# Patient Record
Sex: Female | Born: 1955 | Race: White | Hispanic: No | Marital: Married | Smoking: Former smoker
Health system: Southern US, Community
[De-identification: ages and names within clinical notes are randomized; demographics above are authoritative.]

## PROBLEM LIST (undated history)

## (undated) DIAGNOSIS — M1990 Unspecified osteoarthritis, unspecified site: Secondary | ICD-10-CM

## (undated) DIAGNOSIS — K5790 Diverticulosis of intestine, part unspecified, without perforation or abscess without bleeding: Secondary | ICD-10-CM

## (undated) DIAGNOSIS — Z9889 Other specified postprocedural states: Secondary | ICD-10-CM

## (undated) DIAGNOSIS — R112 Nausea with vomiting, unspecified: Secondary | ICD-10-CM

## (undated) DIAGNOSIS — T753XXA Motion sickness, initial encounter: Secondary | ICD-10-CM

## (undated) DIAGNOSIS — R0600 Dyspnea, unspecified: Secondary | ICD-10-CM

## (undated) DIAGNOSIS — I10 Essential (primary) hypertension: Secondary | ICD-10-CM

## (undated) DIAGNOSIS — J449 Chronic obstructive pulmonary disease, unspecified: Secondary | ICD-10-CM

## (undated) DIAGNOSIS — K5792 Diverticulitis of intestine, part unspecified, without perforation or abscess without bleeding: Secondary | ICD-10-CM

## (undated) DIAGNOSIS — K5732 Diverticulitis of large intestine without perforation or abscess without bleeding: Secondary | ICD-10-CM

## (undated) DIAGNOSIS — K635 Polyp of colon: Secondary | ICD-10-CM

## (undated) DIAGNOSIS — Z974 Presence of external hearing-aid: Secondary | ICD-10-CM

## (undated) DIAGNOSIS — K08109 Complete loss of teeth, unspecified cause, unspecified class: Secondary | ICD-10-CM

## (undated) HISTORY — PX: TONSILLECTOMY: SUR1361

## (undated) HISTORY — DX: Diverticulosis of intestine, part unspecified, without perforation or abscess without bleeding: K57.90

## (undated) HISTORY — PX: ABDOMINAL HYSTERECTOMY: SHX81

## (undated) HISTORY — PX: APPENDECTOMY: SHX54

## (undated) HISTORY — PX: EYE SURGERY: SHX253

---
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
## 2006-03-30 ENCOUNTER — Ambulatory Visit: Payer: Self-pay

## 2006-05-24 ENCOUNTER — Ambulatory Visit: Payer: Self-pay | Admitting: Internal Medicine

## 2006-05-25 ENCOUNTER — Ambulatory Visit: Payer: Self-pay | Admitting: Internal Medicine

## 2006-05-27 ENCOUNTER — Ambulatory Visit: Payer: Self-pay | Admitting: Unknown Physician Specialty

## 2007-05-01 ENCOUNTER — Ambulatory Visit: Payer: Self-pay | Admitting: Family Medicine

## 2008-05-07 ENCOUNTER — Ambulatory Visit: Payer: Self-pay

## 2008-11-06 ENCOUNTER — Ambulatory Visit: Payer: Self-pay

## 2009-08-26 ENCOUNTER — Ambulatory Visit: Payer: Self-pay

## 2010-07-31 ENCOUNTER — Other Ambulatory Visit (HOSPITAL_COMMUNITY): Payer: Self-pay | Admitting: Family Medicine

## 2010-07-31 ENCOUNTER — Ambulatory Visit (HOSPITAL_COMMUNITY)
Admission: RE | Admit: 2010-07-31 | Discharge: 2010-07-31 | Disposition: A | Discharge status: Home / Self Care | Payer: Self-pay | Source: Ambulatory Visit | Attending: Family Medicine | Admitting: Family Medicine

## 2010-07-31 DIAGNOSIS — R51 Headache: Secondary | ICD-10-CM

## 2010-07-31 DIAGNOSIS — R42 Dizziness and giddiness: Secondary | ICD-10-CM | POA: Insufficient documentation

## 2010-07-31 LAB — CREATININE, SERUM
Creatinine, Ser: 0.88 mg/dL (ref 0.4–1.2)
GFR calc Af Amer: >60 mL/min (ref >60)
GFR calc non Af Amer: >60 mL/min (ref >60)

## 2010-07-31 MED ORDER — GADOBENATE DIMEGLUMINE 529 MG/ML IV SOLN: 17.0000 mL | Freq: Once | INTRAVENOUS | Status: AC | PRN

## 2010-10-23 ENCOUNTER — Ambulatory Visit: Payer: Self-pay | Admitting: Family Medicine

## 2011-11-01 ENCOUNTER — Inpatient Hospital Stay (HOSPITAL_COMMUNITY)
Admission: EM | Admit: 2011-11-01 | Discharge: 2011-11-03 | DRG: 392 | Disposition: A | Discharge status: Home / Self Care | Payer: Self-pay | Attending: Internal Medicine | Admitting: Internal Medicine

## 2011-11-01 ENCOUNTER — Encounter (HOSPITAL_COMMUNITY): Payer: Self-pay | Admitting: *Deleted

## 2011-11-01 ENCOUNTER — Emergency Department (HOSPITAL_COMMUNITY): Payer: Self-pay

## 2011-11-01 DIAGNOSIS — IMO0001 Reserved for inherently not codable concepts without codable children: Secondary | ICD-10-CM | POA: Diagnosis present

## 2011-11-01 DIAGNOSIS — I10 Essential (primary) hypertension: Secondary | ICD-10-CM

## 2011-11-01 DIAGNOSIS — Z7982 Long term (current) use of aspirin: Secondary | ICD-10-CM

## 2011-11-01 DIAGNOSIS — K5792 Diverticulitis of intestine, part unspecified, without perforation or abscess without bleeding: Secondary | ICD-10-CM

## 2011-11-01 DIAGNOSIS — F17200 Nicotine dependence, unspecified, uncomplicated: Secondary | ICD-10-CM | POA: Diagnosis present

## 2011-11-01 DIAGNOSIS — R109 Unspecified abdominal pain: Secondary | ICD-10-CM

## 2011-11-01 DIAGNOSIS — R52 Pain, unspecified: Secondary | ICD-10-CM

## 2011-11-01 DIAGNOSIS — K5732 Diverticulitis of large intestine without perforation or abscess without bleeding: Principal | ICD-10-CM

## 2011-11-01 DIAGNOSIS — Z90710 Acquired absence of both cervix and uterus: Secondary | ICD-10-CM

## 2011-11-01 DIAGNOSIS — K5900 Constipation, unspecified: Secondary | ICD-10-CM | POA: Diagnosis present

## 2011-11-01 DIAGNOSIS — K5730 Diverticulosis of large intestine without perforation or abscess without bleeding: Secondary | ICD-10-CM | POA: Diagnosis present

## 2011-11-01 DIAGNOSIS — Z9089 Acquired absence of other organs: Secondary | ICD-10-CM

## 2011-11-01 DIAGNOSIS — E119 Type 2 diabetes mellitus without complications: Secondary | ICD-10-CM

## 2011-11-01 HISTORY — DX: Essential (primary) hypertension: I10

## 2011-11-01 LAB — CBC WITH DIFFERENTIAL/PLATELET
Basophils Relative: 0 % (ref 0–1)
Eosinophils Absolute: 0.2 10*3/uL (ref 0.0–0.7)
Eosinophils Relative: 2 % (ref 0–5)
HCT: 38.2 % (ref 36.0–46.0)
Hemoglobin: 13 g/dL (ref 12.0–15.0)
MCH: 31.3 pg (ref 26.0–34.0)
MCHC: 34 g/dL (ref 30.0–36.0)
MCV: 91.8 fL (ref 78.0–100.0)
Monocytes Absolute: 0.8 10*3/uL (ref 0.1–1.0)
Monocytes Relative: 8 % (ref 3–12)
RDW: 13.3 % (ref 11.5–15.5)

## 2011-11-01 LAB — URINALYSIS, ROUTINE W REFLEX MICROSCOPIC
Ketones, ur: NEGATIVE mg/dL
Leukocytes, UA: NEGATIVE
Nitrite: NEGATIVE
pH: 5.5 (ref 5.0–8.0)

## 2011-11-01 LAB — COMPREHENSIVE METABOLIC PANEL
Albumin: 3.7 g/dL (ref 3.5–5.2)
BUN: 13 mg/dL (ref 6–23)
Calcium: 9.3 mg/dL (ref 8.4–10.5)
Chloride: 100 mEq/L (ref 96–112)
Creatinine, Ser: 0.95 mg/dL (ref 0.50–1.10)
Total Bilirubin: 0.7 mg/dL (ref 0.3–1.2)
Total Protein: 7.4 g/dL (ref 6.0–8.3)

## 2011-11-01 LAB — LIPASE, BLOOD: Lipase: 42 U/L (ref 11–59)

## 2011-11-01 MED ORDER — ONDANSETRON HCL 4 MG/2ML IJ SOLN: 4.0000 mg | Freq: Three times a day (TID) | INTRAMUSCULAR | Status: DC | PRN

## 2011-11-01 MED ORDER — HYDROMORPHONE HCL PF 1 MG/ML IJ SOLN: 1.0000 mg | INTRAMUSCULAR | Status: DC | PRN

## 2011-11-01 MED ORDER — HYDROMORPHONE HCL PF 1 MG/ML IJ SOLN
1.0000 mg | Freq: Once | INTRAMUSCULAR | Status: AC
  Administered 2011-11-01: 1 mg via INTRAVENOUS
  Filled 2011-11-01: qty 1

## 2011-11-01 MED ORDER — METRONIDAZOLE IN NACL 5-0.79 MG/ML-% IV SOLN
500.0000 mg | Freq: Once | INTRAVENOUS | Status: AC
  Administered 2011-11-01: 500 mg via INTRAVENOUS
  Filled 2011-11-01: qty 100

## 2011-11-01 MED ORDER — SODIUM CHLORIDE 0.9 % IV SOLN: INTRAVENOUS | Status: DC

## 2011-11-01 MED ORDER — CIPROFLOXACIN IN D5W 400 MG/200ML IV SOLN
400.0000 mg | Freq: Once | INTRAVENOUS | Status: AC
  Administered 2011-11-01: 400 mg via INTRAVENOUS
  Filled 2011-11-01: qty 200

## 2011-11-01 MED ORDER — SODIUM CHLORIDE 0.9 % IV BOLUS (SEPSIS)
1000.0000 mL | Freq: Once | INTRAVENOUS | Status: AC
  Administered 2011-11-01: 1000 mL via INTRAVENOUS

## 2011-11-01 MED ORDER — KETOROLAC TROMETHAMINE 30 MG/ML IJ SOLN
30.0000 mg | Freq: Once | INTRAMUSCULAR | Status: AC
  Administered 2011-11-01: 30 mg via INTRAVENOUS
  Filled 2011-11-01: qty 1

## 2011-11-01 MED ORDER — IOHEXOL 300 MG/ML  SOLN
100.0000 mL | Freq: Once | INTRAMUSCULAR | Status: AC | PRN
  Administered 2011-11-01: 100 mL via INTRAVENOUS

## 2011-11-01 MED ORDER — ONDANSETRON HCL 4 MG/2ML IJ SOLN
4.0000 mg | Freq: Once | INTRAMUSCULAR | Status: AC
  Administered 2011-11-01: 4 mg via INTRAVENOUS
  Filled 2011-11-01: qty 2

## 2011-11-01 NOTE — ED Notes (Signed)
Low abd pain x 2 days, sent from [facility].  Nausea , no vomiting.  Last bm on Friday

## 2011-11-01 NOTE — ED Provider Notes (Signed)
History  This chart was scribed for Jessica Lennert, MD by Ladona Ridgel Day. This patient was seen in room APA12/APA12 and the patient's care was started at 1735.   CSN: [id number]  Arrival date & time 11/01/11  1735   First MD Initiated Contact with Patient 11/01/11 1919      Chief Complaint  Patient presents with  . Abdominal Pain    Patient is a 56 y.o. female presenting with abdominal pain. The history is provided by the patient. No language interpreter was used.  Abdominal Pain The primary symptoms of the illness include abdominal pain, fever and nausea. The primary symptoms of the illness do not include fatigue, shortness of breath or vomiting. The current episode started 2 days ago. The onset of the illness was gradual. The problem has been gradually worsening.  The illness is associated with laxative use. The patient has had a change in bowel habit (Decreased BMs. ). Symptoms associated with the illness do not include chills or back pain.   Jessica Underwood is a 56 y.o. female who presents to the Emergency Department complaining of constant abdominal pain for two days and was sent from [facility] family practice today for further evaluation. She states workup with abdominal X-ray from PCP shows gallstones and possible bowel blockage. She took some Miralax today but only was able to pass mucous stool. She states nausea and fever as associated symptoms. She denies any previous episodes similar to today. She denies any emesis at this time. She has had a previous Hysterectomy.    Past Medical History  Diagnosis Date  . Hypertension   . Diabetes mellitus     Past Surgical History  Procedure Date  . Tonsillectomy   . Abdominal hysterectomy   . Appendectomy     History reviewed. No pertinent family history.  History  Substance Use Topics  . Smoking status: Current Everyday Smoker  . Smokeless tobacco: Not on file  . Alcohol Use: No    OB History    Grav Para Term Preterm  Abortions TAB SAB Ect Mult Living                  Review of Systems  Constitutional: Positive for fever. Negative for chills and fatigue.  HENT: Negative for congestion, sore throat, rhinorrhea and neck stiffness.   Eyes: Negative for redness.  Respiratory: Negative for cough, chest tightness and shortness of breath.   Gastrointestinal: Positive for nausea and abdominal pain. Negative for vomiting, blood in stool and abdominal distention.  Genitourinary: Negative for difficulty urinating.  Musculoskeletal: Negative for back pain.  Skin: Negative for color change and pallor.  Neurological: Negative for syncope and headaches.  All other systems reviewed and are negative.    Allergies  Review of patient's allergies indicates no known allergies.  Home Medications  No current outpatient prescriptions on file.  Triage Vitals: BP 150/105  Pulse 100  Temp 98.7 F (37.1 C) (Oral)  Resp 18  Ht 5\' 3"  (1.6 m)  Wt 200 lb (90.719 kg)  BMI 35.43 kg/m2  SpO2 99%  Physical Exam  Nursing note and vitals reviewed. Constitutional: She is oriented to person, place, and time. She appears well-developed and well-nourished. No distress.  HENT:  Head: Normocephalic and atraumatic.  Eyes: EOM are normal.  Neck: Neck supple. No tracheal deviation present.  Cardiovascular: Normal rate, regular rhythm and normal heart sounds.   Pulmonary/Chest: Effort normal and breath sounds normal. No respiratory distress. She has no wheezes.  She has no rales.  Abdominal: Soft. Bowel sounds are normal. She exhibits no distension. There is tenderness (Moderate LLQ tenderness.). There is no rebound and no guarding.  Musculoskeletal: Normal range of motion.  Neurological: She is alert and oriented to person, place, and time.  Skin: Skin is warm and dry.  Psychiatric: She has a normal mood and affect. Her behavior is normal.    ED Course  Procedures (including critical care time) DIAGNOSTIC STUDIES: Oxygen  Saturation is 99% on room air, normal by my interpretation.    COORDINATION OF CARE: At 724 PM Discussed treatment plan with patient which includes abdominal CT, blood work, and urine analysis. Patient agrees.   At 939 PM - I rechecked patient to inform her that her test results indicate diverticulitis and she is to be admitted to the hospital. Her pain symptoms have also improved. Patient understands and agrees.  Labs Reviewed - No data to display No results found.   No diagnosis found.    MDM   The chart was scribed for me under my direct supervision.  I personally performed the history, physical, and medical decision making and all procedures in the evaluation of this patient.Jessica Lennert, MD 11/01/11 2202

## 2011-11-01 NOTE — H&P (Signed)
PCP:   Reynolds Bowl, MD   Chief Complaint:  abd pain x 2 days  HPI: 56 yo female with 2 days of llq abd pain with associated nausea and constipation and subj fever.  No diarrhea.  No vomting.  No screening colonoscopy before.  Improved with iv pain meds in ed.    Review of Systems:  O/w neg  Past Medical History: Past Medical History  Diagnosis Date  . Hypertension   . Diabetes mellitus    Past Surgical History  Procedure Date  . Tonsillectomy   . Abdominal hysterectomy   . Appendectomy     Medications: Prior to Admission medications   Medication Sig Start Date End Date Taking? Authorizing Provider  ALPRAZolam (XANAX) 0.25 MG tablet Take 0.25 mg by mouth every morning. **May take an additional tablet if needed.**   Yes Historical Provider, MD  aspirin EC 81 MG tablet Take 81 mg by mouth daily.   Yes Historical Provider, MD    Allergies:  No Known Allergies  Social History:  reports that she has been smoking.  She does not have any smokeless tobacco history on file. She reports that she does not drink alcohol or use illicit drugs.  Family History: History reviewed. No pertinent family history.  Physical Exam: Filed Vitals:   11/01/11 1801 11/01/11 2130  BP: 150/105 116/54  Pulse: 100 96  Temp: 98.7 F (37.1 C)   TempSrc: Oral   Resp: 18 16  Height: 5\' 3"  (1.6 m)   Weight: 90.719 kg (200 lb)   SpO2: 99% 95%   General appearance: alert, cooperative and no distress Lungs: clear to auscultation bilaterally Heart: regular rate and rhythm, S1, S2 normal, no murmur, click, rub or gallop Abdomen: soft nd, ttp llq no r/g pos bs nonacute abd Extremities: extremities normal, atraumatic, no cyanosis or edema Pulses: 2+ and symmetric Skin: Skin color, texture, turgor normal. No rashes or lesions Neurologic: Grossly normal    Labs on Admission:   [facility] 11/01/11 1944  NA 137  K 3.7  CL 100  CO2 28  GLUCOSE 104*  BUN 13  CREATININE 0.95  CALCIUM 9.3    MG --  PHOS --    Basename 11/01/11 1944  AST 42*  ALT 35  ALKPHOS 134*  BILITOT 0.7  PROT 7.4  ALBUMIN 3.7    Basename 11/01/11 1944  LIPASE 42  AMYLASE --    Basename 11/01/11 1944  WBC 9.7  NEUTROABS 6.8  HGB 13.0  HCT 38.2  MCV 91.8  PLT 193    Radiological Exams on Admission: Ct Abdomen Pelvis W Contrast  11/01/2011  *RADIOLOGY REPORT*  Clinical Data: Left lower quadrant abdominal pain, nausea and fever.  CT ABDOMEN AND PELVIS WITH CONTRAST  Technique:  Multidetector CT imaging of the abdomen and pelvis was performed following the standard protocol during bolus administration of intravenous contrast.  Contrast: OMNIPAQUE IOHEXOL 300 MG/ML SOLN  Comparison: None.  Findings: There is evidence of focal thickening involving a segment of the distal sigmoid colon with significant surrounding inflammatory changes present.  Multiple diverticula are noted emanating from this region of the sigmoid colon and findings most likely reflect acute diverticulitis.  Given the prominent thickening of the colon present, underlying tumor cannot be excluded by CT.  There is no evidence of associated abscess or bowel obstruction. No overt extraluminal air is identified.  Multiple calcified gallstones are present dependently in the gallbladder.  No associated gallbladder wall thickening or surrounding inflammation.  The liver, spleen, pancreas, adrenal glands and kidneys are within normal limits.  The bladder is unremarkable.  No masses or enlarged lymph nodes are identified.  No hernias are seen.  Degenerative spondylosis present at the L5-S1 level in the spine.  IMPRESSION: Acute diverticulitis of the distal sigmoid colon with intense inflammation and focal sigmoid colonic wall thickening present.  No associated abscess or obstruction.  Underlying tumor cannot be excluded by CT.  Original Report Authenticated By: Reola Calkins, M.D.    Assessment/Plan Present on Admission:  56 yo female  with acute diverticulitis .Abdominal pain, acute .Acute diverticulosis .Hypertension .DM (diabetes mellitus)  Iv cipro/flagyl.  Full liq diet.   nonacute abd. ivf Raden Byington A [id number] 11/01/2011, 10:09 PM

## 2011-11-02 LAB — BASIC METABOLIC PANEL
CO2: 27 mEq/L (ref 19–32)
Calcium: 8.7 mg/dL (ref 8.4–10.5)
Chloride: 102 mEq/L (ref 96–112)
Glucose, Bld: 130 mg/dL — ABNORMAL HIGH (ref 70–99)
Potassium: 3.9 mEq/L (ref 3.5–5.1)
Sodium: 138 mEq/L (ref 135–145)

## 2011-11-02 LAB — CBC
Hemoglobin: 12.3 g/dL (ref 12.0–15.0)
MCH: 31.5 pg (ref 26.0–34.0)
MCV: 92.3 fL (ref 78.0–100.0)
Platelets: 184 10*3/uL (ref 150–400)
RBC: 3.91 MIL/uL (ref 3.87–5.11)
WBC: 8.4 10*3/uL (ref 4.0–10.5)

## 2011-11-02 MED ORDER — HYDROMORPHONE HCL PF 1 MG/ML IJ SOLN
1.0000 mg | INTRAMUSCULAR | Status: DC | PRN
  Administered 2011-11-02 (×3): 1 mg via INTRAVENOUS
  Filled 2011-11-02 (×3): qty 1

## 2011-11-02 MED ORDER — POTASSIUM CHLORIDE IN NACL 20-0.9 MEQ/L-% IV SOLN
INTRAVENOUS | Status: AC
  Administered 2011-11-02: via INTRAVENOUS

## 2011-11-02 MED ORDER — ALPRAZOLAM 0.25 MG PO TABS
0.2500 mg | ORAL_TABLET | Freq: Every day | ORAL | Status: DC
  Administered 2011-11-02 – 2011-11-03 (×2): 0.25 mg via ORAL
  Filled 2011-11-02 (×2): qty 1

## 2011-11-02 MED ORDER — ONDANSETRON HCL 4 MG/2ML IJ SOLN
4.0000 mg | INTRAMUSCULAR | Status: DC | PRN
  Administered 2011-11-02 – 2011-11-03 (×3): 4 mg via INTRAVENOUS
  Filled 2011-11-02 (×2): qty 2

## 2011-11-02 MED ORDER — METRONIDAZOLE IN NACL 5-0.79 MG/ML-% IV SOLN
500.0000 mg | Freq: Three times a day (TID) | INTRAVENOUS | Status: DC
  Administered 2011-11-02 – 2011-11-03 (×5): 500 mg via INTRAVENOUS
  Filled 2011-11-02 (×8): qty 100

## 2011-11-02 MED ORDER — CLONAZEPAM 0.5 MG PO TABS: 0.5000 mg | ORAL_TABLET | Freq: Two times a day (BID) | ORAL | Status: DC

## 2011-11-02 MED ORDER — TIOTROPIUM BROMIDE MONOHYDRATE 18 MCG IN CAPS
18.0000 ug | ORAL_CAPSULE | Freq: Every day | RESPIRATORY_TRACT | Status: DC
  Administered 2011-11-03: 18 ug via RESPIRATORY_TRACT
  Filled 2011-11-02: qty 5

## 2011-11-02 MED ORDER — HYDROMORPHONE HCL PF 1 MG/ML IJ SOLN
2.0000 mg | INTRAMUSCULAR | Status: DC | PRN
  Administered 2011-11-02: 2 mg via INTRAVENOUS
  Filled 2011-11-02: qty 1
  Filled 2011-11-02: qty 2

## 2011-11-02 MED ORDER — ONDANSETRON HCL 4 MG PO TABS: 4.0000 mg | ORAL_TABLET | Freq: Four times a day (QID) | ORAL | Status: DC | PRN

## 2011-11-02 MED ORDER — SODIUM CHLORIDE 0.9 % IV SOLN
INTRAVENOUS | Status: DC
  Filled 2011-11-02 (×2): qty 1000

## 2011-11-02 MED ORDER — CIPROFLOXACIN IN D5W 400 MG/200ML IV SOLN
400.0000 mg | Freq: Two times a day (BID) | INTRAVENOUS | Status: DC
  Administered 2011-11-02 – 2011-11-03 (×3): 400 mg via INTRAVENOUS
  Filled 2011-11-02 (×5): qty 200

## 2011-11-02 MED ORDER — SODIUM CHLORIDE 0.9 % IJ SOLN
INTRAMUSCULAR | Status: AC
  Filled 2011-11-02: qty 3

## 2011-11-02 MED ORDER — BISACODYL 10 MG RE SUPP: 10.0000 mg | Freq: Every day | RECTAL | Status: DC | PRN

## 2011-11-02 MED ORDER — CARVEDILOL 3.125 MG PO TABS
3.1250 mg | ORAL_TABLET | Freq: Two times a day (BID) | ORAL | Status: DC
  Administered 2011-11-02 – 2011-11-03 (×3): 3.125 mg via ORAL
  Filled 2011-11-02 (×3): qty 1

## 2011-11-02 MED ORDER — ASPIRIN EC 81 MG PO TBEC
81.0000 mg | DELAYED_RELEASE_TABLET | Freq: Every day | ORAL | Status: DC
  Administered 2011-11-02 – 2011-11-03 (×2): 81 mg via ORAL
  Filled 2011-11-02 (×2): qty 1

## 2011-11-02 MED ORDER — METRONIDAZOLE IN NACL 5-0.79 MG/ML-% IV SOLN
INTRAVENOUS | Status: AC
  Filled 2011-11-02: qty 100

## 2011-11-02 MED ORDER — ONDANSETRON HCL 4 MG/2ML IJ SOLN
4.0000 mg | Freq: Four times a day (QID) | INTRAMUSCULAR | Status: DC | PRN
  Administered 2011-11-02 (×3): 4 mg via INTRAVENOUS
  Filled 2011-11-02 (×3): qty 2

## 2011-11-02 NOTE — Progress Notes (Signed)
UR Chart Review Completed  

## 2011-11-02 NOTE — Progress Notes (Signed)
[facility] PROGRESS NOTE  Jessica Underwood YNW:[id number] DOB: 10-31-1955 DOA: 11/01/2011 PCP: Jessica Bowl, MD  Assessment/Plan: Principal Problem:  *Acute diverticulosis Active Problems:  Hypertension  Abdominal pain, acute  DM (diabetes mellitus)  1. Acute diverticulitis. Patient was started on ciprofloxacin and Flagyl. She still having significant abdominal pain with full liquids. We will not advance her diet until her symptoms have further improved. If her pain persists then she'll have to be made n.p.o. Once her acute illness has resolved she will be outpatient followup with a gastroenterologist for colonoscopy. 2. Hypertension: On Coreg, stable  Code Status: Full code Family Communication: Discussed with patient and family at bedside Disposition Plan: Discharge home a clinically improved   Brief narrative: Jessica Underwood was admitted to the hospital 2 days of left lower quadrant abdominal pain associated with nausea and subjective fever. CT scan of the abdomen and pelvis showed an acute diverticulitis of the sigmoid colon. Underlying tumor could not be excluded by CT.  Consultants:  None  Procedures:   none  Antibiotics:  Ciprofloxacin  Flagyl  HPI/Subjective: Continued abdominal pain and vomiting. Patient reports her symptoms got worse after she had potato soup. She feels she tolerates jello better. She did have one loose bowel movement this morning  Objective: Filed Vitals:   11/01/11 1801 11/01/11 2130 11/01/11 2344 11/02/11 0534  BP: 150/105 116/54 127/77 127/81  Pulse: 100 96 90 77  Temp: 98.7 F (37.1 C)  98.3 F (36.8 C) 97.8 F (36.6 C)  TempSrc: Oral  Oral Oral  Resp: 18 16 17 18   Height: 5\' 3"  (1.6 m)     Weight: 90.719 kg (200 lb)  94.257 kg (207 lb 12.8 oz)   SpO2: 99% 95% 90% 94%    Intake/Output Summary (Last 24 hours) at 11/02/11 1301 Last data filed at 11/02/11 0526  Gross per 24 hour  Intake 803.51 ml  Output      0 ml  Net 803.51 ml      Exam:   General:  NAD   Cardiovascular: s1, s2, rrr  Respiratory: cta b  Abdomen: soft, tender in lower abd, nondistended, bs+  Data Reviewed: Basic Metabolic Panel:  Lab 11/02/11 6578 11/01/11 1944  NA 138 137  K 3.9 3.7  CL 102 100  CO2 27 28  GLUCOSE 130* 104*  BUN 12 13  CREATININE 0.81 0.95  CALCIUM 8.7 9.3  MG -- --  PHOS -- --   Liver Function Tests:  Lab 11/01/11 1944  AST 42*  ALT 35  ALKPHOS 134*  BILITOT 0.7  PROT 7.4  ALBUMIN 3.7    Lab 11/01/11 1944  LIPASE 42  AMYLASE --   No results found for this basename: AMMONIA:5 in the last 168 hours CBC:  Lab 11/02/11 0441 11/01/11 1944  WBC 8.4 9.7  NEUTROABS -- 6.8  HGB 12.3 13.0  HCT 36.1 38.2  MCV 92.3 91.8  PLT 184 193   Cardiac Enzymes: No results found for this basename: CKTOTAL:5,CKMB:5,CKMBINDEX:5,TROPONINI:5 in the last 168 hours BNP (last 3 results) No results found for this basename: PROBNP:3 in the last 8760 hours CBG: No results found for this basename: GLUCAP:5 in the last 168 hours  No results found for this or any previous visit (from the past 240 hour(s)).   Studies: Ct Abdomen Pelvis W Contrast  11/01/2011  *RADIOLOGY REPORT*  Clinical Data: Left lower quadrant abdominal pain, nausea and fever.  CT ABDOMEN AND PELVIS WITH CONTRAST  Technique:  Multidetector CT imaging  of the abdomen and pelvis was performed following the standard protocol during bolus administration of intravenous contrast.  Contrast: OMNIPAQUE IOHEXOL 300 MG/ML SOLN  Comparison: None.  Findings: There is evidence of focal thickening involving a segment of the distal sigmoid colon with significant surrounding inflammatory changes present.  Multiple diverticula are noted emanating from this region of the sigmoid colon and findings most likely reflect acute diverticulitis.  Given the prominent thickening of the colon present, underlying tumor cannot be excluded by CT.  There is no evidence of associated  abscess or bowel obstruction. No overt extraluminal air is identified.  Multiple calcified gallstones are present dependently in the gallbladder.  No associated gallbladder wall thickening or surrounding inflammation.  The liver, spleen, pancreas, adrenal glands and kidneys are within normal limits.  The bladder is unremarkable.  No masses or enlarged lymph nodes are identified.  No hernias are seen.  Degenerative spondylosis present at the L5-S1 level in the spine.  IMPRESSION: Acute diverticulitis of the distal sigmoid colon with intense inflammation and focal sigmoid colonic wall thickening present.  No associated abscess or obstruction.  Underlying tumor cannot be excluded by CT.  Original Report Authenticated By: Reola Calkins, M.D.    Scheduled Meds:   . ALPRAZolam  0.25 mg Oral Q breakfast  . aspirin EC  81 mg Oral Daily  . ciprofloxacin  400 mg Intravenous Once  . ciprofloxacin  400 mg Intravenous Q12H  .  HYDROmorphone (DILAUDID) injection  1 mg Intravenous Once  . ketorolac  30 mg Intravenous Once  . metronidazole  500 mg Intravenous Once  . metronidazole  500 mg Intravenous Q8H  . ondansetron  4 mg Intravenous Once  . sodium chloride  1,000 mL Intravenous Once  . DISCONTD: sodium chloride   Intravenous STAT   Continuous Infusions:   . 0.9 % NaCl with KCl 20 mEq / L 75 mL/hr at 11/02/11 0021    Principal Problem:  *Acute diverticulosis Active Problems:  Hypertension  Abdominal pain, acute  DM (diabetes mellitus)    Time spent:    Jessica Underwood  [facility] Pager [phone]. If 7PM-7AM, please contact night-coverage at [email or web address], password [facility] 11/02/2011, 1:01 PM  LOS: 1 day

## 2011-11-03 MED ORDER — CIPROFLOXACIN HCL 500 MG PO TABS: 500.0000 mg | ORAL_TABLET | Freq: Two times a day (BID) | ORAL | Status: AC

## 2011-11-03 MED ORDER — SODIUM CHLORIDE 0.9 % IJ SOLN
INTRAMUSCULAR | Status: AC
  Filled 2011-11-03: qty 3

## 2011-11-03 MED ORDER — SODIUM CHLORIDE 0.9 % IJ SOLN
INTRAMUSCULAR | Status: AC
  Administered 2011-11-03: 11:00:00
  Filled 2011-11-03: qty 3

## 2011-11-03 MED ORDER — ONDANSETRON HCL 4 MG/2ML IJ SOLN
INTRAMUSCULAR | Status: AC
  Administered 2011-11-03: 4 mg via INTRAVENOUS
  Filled 2011-11-03: qty 2

## 2011-11-03 MED ORDER — ONDANSETRON HCL 4 MG PO TABS: 4.0000 mg | ORAL_TABLET | Freq: Four times a day (QID) | ORAL | Status: AC | PRN

## 2011-11-03 MED ORDER — HYDROCODONE-ACETAMINOPHEN 5-500 MG PO TABS: 1.0000 | ORAL_TABLET | Freq: Four times a day (QID) | ORAL | Status: AC | PRN

## 2011-11-03 MED ORDER — METRONIDAZOLE 500 MG PO TABS: 500.0000 mg | ORAL_TABLET | Freq: Three times a day (TID) | ORAL | Status: AC

## 2011-11-03 MED ORDER — POTASSIUM CHLORIDE IN NACL 20-0.9 MEQ/L-% IV SOLN
INTRAVENOUS | Status: DC
  Administered 2011-11-03: 08:00:00 via INTRAVENOUS

## 2011-11-03 NOTE — Discharge Summary (Signed)
Physician Discharge Summary  Jessica Underwood ZOX:[id number] DOB: October 07, 1955 DOA: 11/01/2011  PCP: Reynolds Bowl, MD  Admit date: 11/01/2011 Discharge date: 11/03/2011  Recommendations for Outpatient Follow-up:  1. Discharge home follow up with primary care doctor in one to 2 weeks 2. She will need outpatient colonoscopy. I have asked the staff to arrange for that appointment.  Discharge Diagnoses:  Principal Problem:  *Acute diverticulosis Active Problems:  Hypertension  Abdominal pain, acute  DM (diabetes mellitus)   Discharge Condition: Improved  Diet recommendation: High-fiber diet  History of present illness:  56 yo female with 2 days of llq abd pain with associated nausea and constipation and subj fever. No diarrhea. No vomting. No screening colonoscopy before. Improved with iv pain meds in ed.   Hospital Course:  Patient was placed on empiric antibiotics with ciprofloxacin and Flagyl. She was kept on a liquid diet. Her abdominal pain has since improved her nausea has resolved. Her diet was advanced to solid food and she tolerated this well. She did not have any fever has a normal WBC count. We will switch her antibiotics to by mouth to complete her course. She will need a colonoscopy once her acute inflammation has resolved in the next few weeks to evaluate colonic thickening and to rule out any underlying tumor. The remainder of her medical issues have remained stable  Procedures:  None  Consultations:  None  Discharge Exam: Filed Vitals:   11/03/11 0452  BP: 165/88  Pulse: 81  Temp: 98.5 F (36.9 C)  Resp: 18   Filed Vitals:   11/02/11 1330 11/02/11 2128 11/03/11 0452 11/03/11 0729  BP: 147/81 130/80 165/88   Pulse: 79 79 81   Temp: 97.5 F (36.4 C) 98 F (36.7 C) 98.5 F (36.9 C)   TempSrc:  Oral Oral   Resp: 18 18 18    Height:      Weight:      SpO2: 96% 91% 92% 95%   General: No acute distress Cardiovascular: S1, S2, regular rate and  rhythm Respiratory: Clear to auscultation bilaterally Abdomen: Soft, mildly tender on the left side, bowel sounds are active  Discharge Instructions  Discharge Orders    Future Orders Please Complete By Expires   Diet - low sodium heart healthy      Increase activity slowly      Call MD for:  temperature >100.4      Call MD for:  severe uncontrolled pain      Call MD for:  persistant nausea and vomiting        Medication List  As of 11/03/2011  8:18 PM   TAKE these medications         ALPRAZolam 0.25 MG tablet   Commonly known as: XANAX   Take 0.25 mg by mouth every morning. **May take an additional tablet if needed.**      aspirin EC 81 MG tablet   Take 81 mg by mouth daily.      carvedilol 3.125 MG tablet   Commonly known as: COREG   Take 3.125 mg by mouth 2 (two) times daily.      ciprofloxacin 500 MG tablet   Commonly known as: CIPRO   Take 1 tablet (500 mg total) by mouth 2 (two) times daily.      clonazePAM 0.5 MG tablet   Commonly known as: KLONOPIN   Take 0.5 mg by mouth 2 (two) times daily.      HYDROcodone-acetaminophen 5-500 MG per tablet  Commonly known as: VICODIN   Take 1 tablet by mouth every 6 (six) hours as needed for pain.      metroNIDAZOLE 500 MG tablet   Commonly known as: FLAGYL   Take 1 tablet (500 mg total) by mouth 3 (three) times daily.      ondansetron 4 MG tablet   Commonly known as: ZOFRAN   Take 1 tablet (4 mg total) by mouth every 6 (six) hours as needed for nausea.      tiotropium 18 MCG inhalation capsule   Commonly known as: SPIRIVA   Place 18 mcg into inhaler and inhale daily.              The results of significant diagnostics from this hospitalization (including imaging, microbiology, ancillary and laboratory) are listed below for reference.    Significant Diagnostic Studies: Ct Abdomen Pelvis W Contrast  11/01/2011  *RADIOLOGY REPORT*  Clinical Data: Left lower quadrant abdominal pain, nausea and fever.  CT ABDOMEN  AND PELVIS WITH CONTRAST  Technique:  Multidetector CT imaging of the abdomen and pelvis was performed following the standard protocol during bolus administration of intravenous contrast.  Contrast: OMNIPAQUE IOHEXOL 300 MG/ML SOLN  Comparison: None.  Findings: There is evidence of focal thickening involving a segment of the distal sigmoid colon with significant surrounding inflammatory changes present.  Multiple diverticula are noted emanating from this region of the sigmoid colon and findings most likely reflect acute diverticulitis.  Given the prominent thickening of the colon present, underlying tumor cannot be excluded by CT.  There is no evidence of associated abscess or bowel obstruction. No overt extraluminal air is identified.  Multiple calcified gallstones are present dependently in the gallbladder.  No associated gallbladder wall thickening or surrounding inflammation.  The liver, spleen, pancreas, adrenal glands and kidneys are within normal limits.  The bladder is unremarkable.  No masses or enlarged lymph nodes are identified.  No hernias are seen.  Degenerative spondylosis present at the L5-S1 level in the spine.  IMPRESSION: Acute diverticulitis of the distal sigmoid colon with intense inflammation and focal sigmoid colonic wall thickening present.  No associated abscess or obstruction.  Underlying tumor cannot be excluded by CT.  Original Report Authenticated By: Reola Calkins, M.D.    Microbiology: No results found for this or any previous visit (from the past 240 hour(s)).   Labs: Basic Metabolic Panel:  Lab 11/02/11 2725 11/01/11 1944  NA 138 137  K 3.9 3.7  CL 102 100  CO2 27 28  GLUCOSE 130* 104*  BUN 12 13  CREATININE 0.81 0.95  CALCIUM 8.7 9.3  MG -- --  PHOS -- --   Liver Function Tests:  Lab 11/01/11 1944  AST 42*  ALT 35  ALKPHOS 134*  BILITOT 0.7  PROT 7.4  ALBUMIN 3.7    Lab 11/01/11 1944  LIPASE 42  AMYLASE --   No results found for this  basename: AMMONIA:5 in the last 168 hours CBC:  Lab 11/02/11 0441 11/01/11 1944  WBC 8.4 9.7  NEUTROABS -- 6.8  HGB 12.3 13.0  HCT 36.1 38.2  MCV 92.3 91.8  PLT 184 193   Cardiac Enzymes: No results found for this basename: CKTOTAL:5,CKMB:5,CKMBINDEX:5,TROPONINI:5 in the last 168 hours BNP: BNP (last 3 results) No results found for this basename: PROBNP:3 in the last 8760 hours CBG: No results found for this basename: GLUCAP:5 in the last 168 hours  Time coordinating discharge:  Signed:  Izzabella Besse  [facility]  11/03/2011, 8:18 PM

## 2011-11-05 ENCOUNTER — Emergency Department (HOSPITAL_COMMUNITY): Payer: Self-pay

## 2011-11-05 ENCOUNTER — Encounter (HOSPITAL_COMMUNITY): Payer: Self-pay

## 2011-11-05 ENCOUNTER — Emergency Department (HOSPITAL_COMMUNITY)
Admission: EM | Admit: 2011-11-05 | Discharge: 2011-11-05 | Disposition: A | Discharge status: Home / Self Care | Payer: Self-pay | Attending: Emergency Medicine | Admitting: Emergency Medicine

## 2011-11-05 DIAGNOSIS — I10 Essential (primary) hypertension: Secondary | ICD-10-CM | POA: Insufficient documentation

## 2011-11-05 DIAGNOSIS — Z79899 Other long term (current) drug therapy: Secondary | ICD-10-CM | POA: Insufficient documentation

## 2011-11-05 DIAGNOSIS — F17200 Nicotine dependence, unspecified, uncomplicated: Secondary | ICD-10-CM | POA: Insufficient documentation

## 2011-11-05 DIAGNOSIS — E119 Type 2 diabetes mellitus without complications: Secondary | ICD-10-CM | POA: Insufficient documentation

## 2011-11-05 DIAGNOSIS — Z7982 Long term (current) use of aspirin: Secondary | ICD-10-CM | POA: Insufficient documentation

## 2011-11-05 DIAGNOSIS — R197 Diarrhea, unspecified: Secondary | ICD-10-CM | POA: Insufficient documentation

## 2011-11-05 LAB — URINE MICROSCOPIC-ADD ON

## 2011-11-05 LAB — COMPREHENSIVE METABOLIC PANEL
ALT: 26 U/L (ref 0–35)
Alkaline Phosphatase: 102 U/L (ref 39–117)
BUN: 19 mg/dL (ref 6–23)
Chloride: 105 mEq/L (ref 96–112)
GFR calc Af Amer: 65 mL/min — ABNORMAL LOW (ref >90)
Glucose, Bld: 111 mg/dL — ABNORMAL HIGH (ref 70–99)
Potassium: 3.9 mEq/L (ref 3.5–5.1)
Sodium: 141 mEq/L (ref 135–145)
Total Bilirubin: 0.2 mg/dL — ABNORMAL LOW (ref 0.3–1.2)

## 2011-11-05 LAB — CBC WITH DIFFERENTIAL/PLATELET
Basophils Relative: 1 % (ref 0–1)
Eosinophils Relative: 5 % (ref 0–5)
HCT: 35.1 % — ABNORMAL LOW (ref 36.0–46.0)
Hemoglobin: 12.2 g/dL (ref 12.0–15.0)
Lymphocytes Relative: 29 % (ref 12–46)
MCHC: 34.8 g/dL (ref 30.0–36.0)
MCV: 90.7 fL (ref 78.0–100.0)
Monocytes Absolute: 0.4 10*3/uL (ref 0.1–1.0)
Monocytes Relative: 8 % (ref 3–12)
Neutro Abs: 3.2 10*3/uL (ref 1.7–7.7)

## 2011-11-05 LAB — URINALYSIS, ROUTINE W REFLEX MICROSCOPIC
Glucose, UA: NEGATIVE mg/dL
Hgb urine dipstick: NEGATIVE
Protein, ur: NEGATIVE mg/dL
pH: 6 (ref 5.0–8.0)

## 2011-11-05 LAB — CLOSTRIDIUM DIFFICILE BY PCR: Toxigenic C. Difficile by PCR: NEGATIVE

## 2011-11-05 LAB — LIPASE, BLOOD: Lipase: 45 U/L (ref 11–59)

## 2011-11-05 MED ORDER — ONDANSETRON HCL 4 MG/2ML IJ SOLN
4.0000 mg | Freq: Once | INTRAMUSCULAR | Status: AC
  Administered 2011-11-05: 4 mg via INTRAVENOUS
  Filled 2011-11-05: qty 2

## 2011-11-05 MED ORDER — PROMETHAZINE HCL 25 MG PO TABS: 25.0000 mg | ORAL_TABLET | Freq: Four times a day (QID) | ORAL | Status: DC | PRN

## 2011-11-05 MED ORDER — SODIUM CHLORIDE 0.9 % IV SOLN
INTRAVENOUS | Status: DC
  Administered 2011-11-05: 15:00:00 via INTRAVENOUS

## 2011-11-05 MED ORDER — SODIUM CHLORIDE 0.9 % IV SOLN
INTRAVENOUS | Status: DC
  Administered 2011-11-05: 12:00:00 via INTRAVENOUS

## 2011-11-05 MED ORDER — SODIUM CHLORIDE 0.9 % IV BOLUS (SEPSIS)
500.0000 mL | Freq: Once | INTRAVENOUS | Status: AC
  Administered 2011-11-05: 500 mL via INTRAVENOUS

## 2011-11-05 NOTE — ED Notes (Signed)
Pt states she was admitted here for abdominal pain Monday. Went home Wednesday. States she is weak and having diarrhea

## 2011-11-05 NOTE — ED Notes (Signed)
Patient transported to X-ray 

## 2011-11-05 NOTE — ED Provider Notes (Signed)
History     CSN: [id number]  Arrival date & time 11/05/11  1121   First MD Initiated Contact with Patient 11/05/11 1157      Chief Complaint  Patient presents with  . Diarrhea     HPI Pt was seen at 1215.  Per pt, c/o gradual onset and persistence of multiple intermittent episodes of "diarrhea" that began 2 days ago.  Pt describes the stools as "watery," "dark," and "saw some blood in it today."  Has been associated with generalized weakness and nausea.  Pt states she was discharged from the hospital for 2 days ago for dx diverticulitis, has been taking her abx as prescribed.  States her abd pain from that admission has significantly improved, and her abd is "just a little sore now." Denies vomiting, no fevers, no back pain, no rash, no CP/SOB.     Past Medical History  Diagnosis Date  . Hypertension   . Diabetes mellitus     Past Surgical History  Procedure Date  . Tonsillectomy   . Abdominal hysterectomy   . Appendectomy      History  Substance Use Topics  . Smoking status: Current Everyday Smoker  . Smokeless tobacco: Not on file  . Alcohol Use: No    Review of Systems ROS: Statement: All systems negative except as marked or noted in the HPI; Constitutional: +generalized weakness/fatigue. Negative for fever and chills. ; ; Eyes: Negative for eye pain, redness and discharge. ; ; ENMT: Negative for ear pain, hoarseness, nasal congestion, sinus pressure and sore throat. ; ; Cardiovascular: Negative for chest pain, palpitations, diaphoresis, dyspnea and peripheral edema. ; ; Respiratory: Negative for cough, wheezing and stridor. ; ; Gastrointestinal: +nausea, diarrhea, abd pain, blood in stool.  Negative for vomiting, hematemesis, jaundice and rectal bleeding. . ; ; Genitourinary: Negative for dysuria, flank pain and hematuria. ; ; Musculoskeletal: Negative for back pain and neck pain. Negative for swelling and trauma.; ; Skin: Negative for pruritus, rash, abrasions, blisters,  bruising and skin lesion.; ; Neuro: Negative for headache, lightheadedness and neck stiffness. Negative for altered level of consciousness , altered mental status, extremity weakness, paresthesias, involuntary movement, seizure and syncope.       Allergies  Review of patient's allergies indicates no known allergies.  Home Medications   Current Outpatient Rx  Name Route Sig Dispense Refill  . ALPRAZOLAM 0.25 MG PO TABS Oral Take 0.25 mg by mouth every morning. **May take an additional tablet if needed.**    . ASPIRIN EC 81 MG PO TBEC Oral Take 81 mg by mouth daily.    Marland Kitchen CARVEDILOL 3.125 MG PO TABS Oral Take 3.125 mg by mouth 2 (two) times daily.    Marland Kitchen CIPROFLOXACIN HCL 500 MG PO TABS Oral Take 1 tablet (500 mg total) by mouth 2 (two) times daily. 14 tablet 0  . CLONAZEPAM 0.5 MG PO TABS Oral Take 0.5 mg by mouth 2 (two) times daily.    Marland Kitchen HYDROCODONE-ACETAMINOPHEN 5-500 MG PO TABS Oral Take 1 tablet by mouth every 6 (six) hours as needed for pain. 30 tablet 0  . METRONIDAZOLE 500 MG PO TABS Oral Take 1 tablet (500 mg total) by mouth 3 (three) times daily. 21 tablet 0  . ONDANSETRON HCL 4 MG PO TABS Oral Take 1 tablet (4 mg total) by mouth every 6 (six) hours as needed for nausea. 20 tablet 0  . TIOTROPIUM BROMIDE MONOHYDRATE 18 MCG IN CAPS Inhalation Place 18 mcg into inhaler and inhale daily.  BP 120/73  Pulse 80  Temp 97.5 F (36.4 C) (Oral)  Resp 18  Ht 5\' 3"  (1.6 m)  Wt 200 lb (90.719 kg)  BMI 35.43 kg/m2  SpO2 96%  Physical Exam 1220: Physical examination:  Nursing notes reviewed; Vital signs and O2 SAT reviewed;  Constitutional: Well developed, Well nourished, In no acute distress; Head:  Normocephalic, atraumatic; Eyes: EOMI, PERRL, No scleral icterus; ENMT: Mouth and pharynx normal, Mucous membranes dry; Neck: Supple, Full range of motion, No lymphadenopathy; Cardiovascular: Regular rate and rhythm, No murmur, rub, or gallop; Respiratory: Breath sounds clear & equal  bilaterally, No rales, rhonchi, wheezes.  Speaking full sentences with ease, Normal respiratory effort/excursion; Chest: Nontender, Movement normal; Abdomen: Soft, +mild diffuse tenderness to palp, no rebound or guarding. Nondistended, Normal bowel sounds, Rectal exam performed w/permission of pt and ED RN chaparone present.  Anal tone normal.  Non-tender, soft brown stool in rectal vault, heme very faintly positive.  No fissures, no external hemorrhoids, no palp masses.;;; Extremities: Pulses normal, No tenderness, No edema, No calf edema or asymmetry.; Neuro: AA&Ox3, Major CN grossly intact.  Speech clear. No gross focal motor or sensory deficits in extremities.; Skin: Color normal, Warm, Dry.   ED Course  Procedures   MDM  MDM Reviewed: previous chart, nursing note and vitals Reviewed previous: labs and CT scan Interpretation: labs and x-ray     Results for orders placed during the hospital encounter of 11/05/11  CBC WITH DIFFERENTIAL      Component Value Range   WBC 5.6  4.0 - 10.5 K/uL   RBC 3.87  3.87 - 5.11 MIL/uL   Hemoglobin 12.2  12.0 - 15.0 g/dL   HCT 47.4 (*) 25.9 - 56.3 %   MCV 90.7  78.0 - 100.0 fL   MCH 31.5  26.0 - 34.0 pg   MCHC 34.8  30.0 - 36.0 g/dL   RDW 87.5  64.3 - 32.9 %   Platelets 212  150 - 400 K/uL   Neutrophils Relative 58  43 - 77 %   Neutro Abs 3.2  1.7 - 7.7 K/uL   Lymphocytes Relative 29  12 - 46 %   Lymphs Abs 1.6  0.7 - 4.0 K/uL   Monocytes Relative 8  3 - 12 %   Monocytes Absolute 0.4  0.1 - 1.0 K/uL   Eosinophils Relative 5  0 - 5 %   Eosinophils Absolute 0.3  0.0 - 0.7 K/uL   Basophils Relative 1  0 - 1 %   Basophils Absolute 0.0  0.0 - 0.1 K/uL  COMPREHENSIVE METABOLIC PANEL      Component Value Range   Sodium 141  135 - 145 mEq/L   Potassium 3.9  3.5 - 5.1 mEq/L   Chloride 105  96 - 112 mEq/L   CO2 26  19 - 32 mEq/L   Glucose, Bld 111 (*) 70 - 99 mg/dL   BUN 19  6 - 23 mg/dL   Creatinine, Ser 5.18  0.50 - 1.10 mg/dL   Calcium 9.6   8.4 - 84.1 mg/dL   Total Protein 7.0  6.0 - 8.3 g/dL   Albumin 3.5  3.5 - 5.2 g/dL   AST 35  0 - 37 U/L   ALT 26  0 - 35 U/L   Alkaline Phosphatase 102  39 - 117 U/L   Total Bilirubin 0.2 (*) 0.3 - 1.2 mg/dL   GFR calc non Af Amer 56 (*) >90 mL/min   GFR  calc Af Amer 65 (*) >90 mL/min  LIPASE, BLOOD      Component Value Range   Lipase 45  11 - 59 U/L  LACTIC ACID, PLASMA      Component Value Range   Lactic Acid, Venous 1.4  0.5 - 2.2 mmol/L  PROCALCITONIN      Component Value Range   Procalcitonin <0.10    URINALYSIS, ROUTINE W REFLEX MICROSCOPIC      Component Value Range   Color, Urine YELLOW  YELLOW   APPearance CLEAR  CLEAR   Specific Gravity, Urine >1.030 (*) 1.005 - 1.030   pH 6.0  5.0 - 8.0   Glucose, UA NEGATIVE  NEGATIVE mg/dL   Hgb urine dipstick NEGATIVE  NEGATIVE   Bilirubin Urine NEGATIVE  NEGATIVE   Ketones, ur NEGATIVE  NEGATIVE mg/dL   Protein, ur NEGATIVE  NEGATIVE mg/dL   Urobilinogen, UA 0.2  0.0 - 1.0 mg/dL   Nitrite NEGATIVE  NEGATIVE   Leukocytes, UA TRACE (*) NEGATIVE  CLOSTRIDIUM DIFFICILE BY PCR      Component Value Range   C difficile by pcr NEGATIVE  NEGATIVE  URINE MICROSCOPIC-ADD ON      Component Value Range   Squamous Epithelial / LPF FEW (*) RARE   WBC, UA 0-2  <3 WBC/hpf   Bacteria, UA FEW (*) RARE     Dg Abd Acute W/chest 11/05/2011  *RADIOLOGY REPORT*  Clinical Data: Abdominal pain.  Nausea and vomiting.  Diarrhea.  ACUTE ABDOMEN SERIES (ABDOMEN 2 VIEW & CHEST 1 VIEW)  Comparison: None.  Findings: The bowel gas pattern is normal.  No evidence of dilated bowel loops or free air.  Multiple calcified gallstones are noted.  Heart size is normal.  Both lungs are clear.  IMPRESSION:  1.  Unremarkable bowel gas pattern. 2.  Cholelithiasis. 3.  No active cardiopulmonary disease.  Original Report Authenticated By: Danae Orleans, M.D.      1530:  Pt states she feels "better now."  Pt has tol PO well while in the ED without N/V.  Has had 3  stools while in the ED, states they are "getting firmer now," no longer watery; feels this is improving.  Now recalls that she had milk this morning before diarrhea began.  Cdiff is negative.  Abd benign on re-exam.  Pt denies abd "pain," states she is "just sore" and definitely improved from when she was admitted to the hospital.  Udip contaminated.  VS stable, afebrile, WBC normal.  Not septic.  Doubt acute intra-abd process at this time.  Long d/w pt and family regarding re-imaging (CT) A/P; they prefer to not have repeat CT scan at this time.  Pt wants to go home.  Dx testing d/w pt and family.  Questions answered.  Verb understanding, agreeable to d/c home with outpt f/u.      The patient appears reasonably screened and/or stabilized for discharge and I doubt any other medical condition or other [facility] requiring further screening, evaluation, or treatment in the ED at this time prior to discharge.   Laray Anger, DO 11/08/11 1040

## 2011-11-07 LAB — URINE CULTURE
Colony Count: NO GROWTH
Culture: NO GROWTH

## 2011-11-09 LAB — STOOL CULTURE

## 2011-11-19 ENCOUNTER — Ambulatory Visit: Payer: Self-pay | Admitting: Family Medicine

## 2013-01-17 ENCOUNTER — Ambulatory Visit: Payer: Self-pay

## 2014-03-18 ENCOUNTER — Ambulatory Visit: Payer: Self-pay

## 2014-04-12 DIAGNOSIS — K635 Polyp of colon: Secondary | ICD-10-CM

## 2014-04-12 HISTORY — PX: COLONOSCOPY W/ POLYPECTOMY: SHX1380

## 2014-04-12 HISTORY — DX: Polyp of colon: K63.5

## 2014-05-09 ENCOUNTER — Emergency Department: Payer: Self-pay | Admitting: Internal Medicine

## 2014-05-09 LAB — CBC
HCT: 40 % (ref 35.0–47.0)
HGB: 13.7 g/dL (ref 12.0–16.0)
MCH: 31.4 pg (ref 26.0–34.0)
MCHC: 34.2 g/dL (ref 32.0–36.0)
MCV: 92 fL (ref 80–100)
PLATELETS: 206 10*3/uL (ref 150–440)
RBC: 4.35 10*6/uL (ref 3.80–5.20)
RDW: 13.2 % (ref 11.5–14.5)
WBC: 9.5 10*3/uL (ref 3.6–11.0)

## 2014-05-09 LAB — URINALYSIS, COMPLETE
Bacteria: NONE SEEN
Bilirubin,UR: NEGATIVE
Blood: NEGATIVE
Glucose,UR: NEGATIVE mg/dL (ref 0–75)
Ketone: NEGATIVE
LEUKOCYTE ESTERASE: NEGATIVE
NITRITE: NEGATIVE
PH: 5 (ref 4.5–8.0)
PROTEIN: NEGATIVE
Specific Gravity: 1.005 (ref 1.003–1.030)
Squamous Epithelial: <1
WBC UR: 1 /HPF (ref 0–5)

## 2014-05-09 LAB — LIPASE, BLOOD: LIPASE: 259 U/L (ref 73–393)

## 2014-05-09 LAB — COMPREHENSIVE METABOLIC PANEL
ALBUMIN: 3.6 g/dL (ref 3.4–5.0)
ALK PHOS: 119 U/L — AB (ref 46–116)
ALT: 46 U/L (ref 14–63)
Anion Gap: 7 (ref 7–16)
BUN: 16 mg/dL (ref 7–18)
Bilirubin,Total: 0.4 mg/dL (ref 0.2–1.0)
CALCIUM: 9.1 mg/dL (ref 8.5–10.1)
Chloride: 107 mmol/L (ref 98–107)
Co2: 24 mmol/L (ref 21–32)
Creatinine: 0.9 mg/dL (ref 0.60–1.30)
EGFR (African American): >60
GLUCOSE: 96 mg/dL (ref 65–99)
OSMOLALITY: 277 (ref 275–301)
Potassium: 3.9 mmol/L (ref 3.5–5.1)
SGOT(AST): 46 U/L — ABNORMAL HIGH (ref 15–37)
SODIUM: 138 mmol/L (ref 136–145)
Total Protein: 7.6 g/dL (ref 6.4–8.2)

## 2014-05-09 LAB — DIFFERENTIAL
Basophil #: 0.1 10*3/uL (ref 0.0–0.1)
Basophil %: 0.5 %
EOS PCT: 2.7 %
Eosinophil #: 0.3 10*3/uL (ref 0.0–0.7)
LYMPHS ABS: 1.6 10*3/uL (ref 1.0–3.6)
LYMPHS PCT: 17.3 %
MONO ABS: 0.6 x10 3/mm (ref 0.2–0.9)
Monocyte %: 6.5 %
Neutrophil #: 6.9 10*3/uL — ABNORMAL HIGH (ref 1.4–6.5)
Neutrophil %: 73 %

## 2015-05-09 ENCOUNTER — Ambulatory Visit: Payer: Medicaid Other | Admitting: Physical Therapy

## 2015-08-18 ENCOUNTER — Other Ambulatory Visit: Payer: Self-pay | Admitting: Family Medicine

## 2017-04-10 ENCOUNTER — Emergency Department (HOSPITAL_COMMUNITY): Payer: Self-pay

## 2017-04-10 ENCOUNTER — Other Ambulatory Visit: Payer: Self-pay

## 2017-04-10 ENCOUNTER — Encounter (HOSPITAL_COMMUNITY): Payer: Self-pay | Admitting: Emergency Medicine

## 2017-04-10 ENCOUNTER — Emergency Department (HOSPITAL_COMMUNITY)
Admission: EM | Admit: 2017-04-10 | Discharge: 2017-04-10 | Disposition: A | Discharge status: Home / Self Care | Payer: Self-pay | Attending: Emergency Medicine | Admitting: Emergency Medicine

## 2017-04-10 DIAGNOSIS — F17210 Nicotine dependence, cigarettes, uncomplicated: Secondary | ICD-10-CM | POA: Insufficient documentation

## 2017-04-10 DIAGNOSIS — Z79899 Other long term (current) drug therapy: Secondary | ICD-10-CM | POA: Insufficient documentation

## 2017-04-10 DIAGNOSIS — J209 Acute bronchitis, unspecified: Secondary | ICD-10-CM | POA: Insufficient documentation

## 2017-04-10 DIAGNOSIS — J449 Chronic obstructive pulmonary disease, unspecified: Secondary | ICD-10-CM | POA: Insufficient documentation

## 2017-04-10 DIAGNOSIS — I10 Essential (primary) hypertension: Secondary | ICD-10-CM | POA: Insufficient documentation

## 2017-04-10 DIAGNOSIS — E119 Type 2 diabetes mellitus without complications: Secondary | ICD-10-CM | POA: Insufficient documentation

## 2017-04-10 DIAGNOSIS — Z7982 Long term (current) use of aspirin: Secondary | ICD-10-CM | POA: Insufficient documentation

## 2017-04-10 HISTORY — DX: Chronic obstructive pulmonary disease, unspecified: J44.9

## 2017-04-10 HISTORY — DX: Diverticulitis of intestine, part unspecified, without perforation or abscess without bleeding: K57.92

## 2017-04-10 LAB — CBG MONITORING, ED: Glucose-Capillary: 94 mg/dL (ref 65–99)

## 2017-04-10 MED ORDER — HYDROCODONE-HOMATROPINE 5-1.5 MG/5ML PO SYRP: 5.0000 mL | ORAL_SOLUTION | Freq: Four times a day (QID) | ORAL | 0 refills | Status: DC | PRN

## 2017-04-10 MED ORDER — PREDNISONE 20 MG PO TABS: 40.0000 mg | ORAL_TABLET | Freq: Every day | ORAL | 0 refills | Status: DC

## 2017-04-10 MED ORDER — DOXYCYCLINE HYCLATE 100 MG PO CAPS: 100.0000 mg | ORAL_CAPSULE | Freq: Two times a day (BID) | ORAL | 0 refills | Status: DC

## 2017-04-10 MED ORDER — HYDROCOD POLST-CPM POLST ER 10-8 MG/5ML PO SUER
5.0000 mL | Freq: Once | ORAL | Status: AC
  Administered 2017-04-10: 5 mL via ORAL
  Filled 2017-04-10: qty 5

## 2017-04-10 NOTE — ED Triage Notes (Addendum)
Patient c/o cough, chest congestion, fevers, and fatigue that started x1 week. Patient states cough started productive but is now nonproductive and "hacky." Chest pain with coughing. Patient states using delsym and day/nightquil with no relief. Patient also states has started to have diarrhea.

## 2017-04-10 NOTE — ED Provider Notes (Signed)
[facility] Provider Note   CSN: [id number] Arrival date & time: 04/10/17  1014     History   Chief Complaint Chief Complaint  Patient presents with  . Cough    HPI Jake ChurchDonna B Umana is a 61 y.o. female.  HPI Jake ChurchDonna B Rembert is a 61 y.o. female with history of COPD, hypertension, prediabetes, presents to emergency with complaint of cough.  Patient with flulike symptoms for the last 4 days, states she is having nasal congestion, sore throat, productive cough, and now chest pain that is only associated with.  Patient states chest pain is so bad when she coughs which is what brought her to emergency department.  Patient is currently taking her albuterol inhaler every 4 hours.  She is taking Delsym cough medication and DayQuil and NyQuil with no relief of her symptoms.  She is a smoker.  She denies any nausea or vomiting but had some diarrhea yesterday.  She reports that she had a fever of 103 yesterday.  She reports generalized weakness and fatigue.  Past Medical History:  Diagnosis Date  . COPD (chronic obstructive pulmonary disease) (HCC)   . Diabetes mellitus   . Diverticulitis   . Hypertension     Patient Active Problem List   Diagnosis Date Noted  . Abdominal pain, acute 11/01/2011  . Acute diverticulosis 11/01/2011  . DM (diabetes mellitus) (HCC) 11/01/2011  . Hypertension     Past Surgical History:  Procedure Laterality Date  . ABDOMINAL HYSTERECTOMY    . APPENDECTOMY    . TONSILLECTOMY      OB History    No data available       Home Medications    Prior to Admission medications   Medication Sig Start Date End Date Taking? Authorizing Provider  ALPRAZolam (XANAX) 0.25 MG tablet Take 0.25 mg by mouth every morning. **May take an additional tablet if needed.**    [provider]  aspirin EC 81 MG tablet Take 81 mg by mouth daily.    [provider]  carvedilol (COREG) 3.125 MG tablet Take 3.125 mg by mouth 2 (two) times  daily.    [provider]  clonazePAM (KLONOPIN) 0.5 MG tablet Take 0.5 mg by mouth 2 (two) times daily.    [provider]  promethazine (PHENERGAN) 25 MG tablet Take 1 tablet (25 mg total) by mouth every 6 (six) hours as needed for nausea. 11/05/11 11/12/11  Samuel JesterMcManus, Kathleen, DO  tiotropium (SPIRIVA) 18 MCG inhalation capsule Place 18 mcg into inhaler and inhale daily.    [provider]    Family History No family history on file.  Social History Social History   Tobacco Use  . Smoking status: Current Every Day Smoker    Packs/day: 0.50    Years: 30.00    Pack years: 15.00    Types: Cigarettes  . Smokeless tobacco: Never Used  Substance Use Topics  . Alcohol use: No  . Drug use: No     Allergies   Patient has no known allergies.   Review of Systems Review of Systems  Constitutional: Positive for chills, fatigue and fever.  HENT: Positive for congestion and sore throat.   Respiratory: Positive for cough, chest tightness and shortness of breath.   Cardiovascular: Positive for chest pain. Negative for palpitations and leg swelling.  Gastrointestinal: Positive for diarrhea. Negative for abdominal pain, nausea and vomiting.  Genitourinary: Negative for dysuria, flank pain and pelvic pain.  Musculoskeletal: Positive for myalgias. Negative  for arthralgias, neck pain and neck stiffness.  Skin: Negative for rash.  Neurological: Positive for weakness and headaches. Negative for dizziness.  All other systems reviewed and are negative.    Physical Exam Updated Vital Signs BP (!) 140/93 (BP Location: Right Arm)   Pulse (!) 102   Temp 98.2 F (36.8 C) (Oral)   Resp 18   Ht 5\' 3"  (1.6 m)   Wt 87.5 kg (193 lb)   SpO2 97%   BMI 34.19 kg/m   Physical Exam  Constitutional: She is oriented to person, place, and time. She appears well-developed and well-nourished. No distress.  HENT:  Head: Normocephalic and atraumatic.  Right Ear: Tympanic  membrane, external ear and ear canal normal.  Left Ear: Tympanic membrane, external ear and ear canal normal.  Nose: Mucosal edema and rhinorrhea present.  Mouth/Throat: Uvula is midline and mucous membranes are normal. Posterior oropharyngeal erythema present. No oropharyngeal exudate, posterior oropharyngeal edema or tonsillar abscesses.  Eyes: Conjunctivae are normal.  Neck: Neck supple.  Cardiovascular: Normal rate, regular rhythm, normal heart sounds and intact distal pulses.  Pulmonary/Chest: Effort normal and breath sounds normal. No respiratory distress. She has no wheezes. She has no rales.  Abdominal: She exhibits no distension.  Musculoskeletal: Normal range of motion.  Neurological: She is alert and oriented to person, place, and time.  Skin: Skin is warm and dry.  Psychiatric: She has a normal mood and affect.  Nursing note and vitals reviewed.    ED Treatments / Results  Labs (all labs ordered are listed, but only abnormal results are displayed) Labs Reviewed  CBG MONITORING, ED    EKG  EKG Interpretation  Date/Time:  Sunday April 10 2017 12:24:00 EST Ventricular Rate:  94 PR Interval:  172 QRS Duration: 86 QT Interval:  386 QTC Calculation: 482 R Axis:   -8 Text Interpretation:  Normal sinus rhythm Anteroseptal infarct , age undetermined Abnormal ECG Confirmed by Donnetta Hutchingook, Brian ([id number]) on 04/10/2017 12:29:57 PM       Radiology Dg Chest 2 View  Result Date: 04/10/2017 CLINICAL DATA:  Cough and chest congestion.  Fatigue. EXAM: CHEST  2 VIEW COMPARISON:  None FINDINGS: The heart size and mediastinal contours are within normal limits. Both lungs are clear. The visualized skeletal structures are unremarkable. IMPRESSION: No active cardiopulmonary disease. Electronically Signed   By: Signa Kellaylor  Stroud M.D.   On: 04/10/2017 11:31    Procedures Procedures (including critical care time)  Medications Ordered in ED Medications  chlorpheniramine-HYDROcodone  (TUSSIONEX) 10-8 MG/5ML suspension 5 mL (not administered)     Initial Impression / Assessment and Plan / ED Course  I have reviewed the triage vital signs and the nursing notes.  Pertinent labs & imaging results that were available during my care of the patient were reviewed by me and considered in my medical decision making (see chart for details).     Patient with flulike symptoms, however reports severe chest pain with coughing.  She does have COPD and is a current smoker.  Reports productive cough.  Reports fever 103 at home yesterday.  Most likely influenza versus other viral upper respiratory infection.  Chest x-ray is negative.  Patient is not wheezing on exam.  Will get EKG and plain CBG.  Will treat with Tussionex.  12:56 PM CBG is 94.  EKG unremarkable. VS normal. Stable for dc home.   Vitals:   04/10/17 1042 04/10/17 1043 04/10/17 1308  BP: (!) 140/93  (!) 116/59  Pulse: (!) 102  86  Resp: 18  20  Temp: 98.2 F (36.8 C)  98.2 F (36.8 C)  TempSrc: Oral  Oral  SpO2: 97%  97%  Weight:  87.5 kg (193 lb)   Height:  5\' 3"  (1.6 m)      Final Clinical Impressions(s) / ED Diagnoses   Final diagnoses:  Acute bronchitis, unspecified organism    ED Discharge Orders        Ordered    predniSONE (DELTASONE) 20 MG tablet  Daily     04/10/17 1312    HYDROcodone-homatropine (HYCODAN) 5-1.5 MG/5ML syrup  Every 6 hours PRN     04/10/17 1312    doxycycline (VIBRAMYCIN) 100 MG capsule  2 times daily     04/10/17 1312       Jaynie Crumble, PA-C 04/10/17 1442    Donnetta Hutching, MD 04/12/17 1410

## 2017-05-04 ENCOUNTER — Ambulatory Visit: Payer: Medicaid Other

## 2017-07-06 ENCOUNTER — Ambulatory Visit
Admission: RE | Admit: 2017-07-06 | Discharge: 2017-07-06 | Disposition: A | Discharge status: Home / Self Care | Payer: Self-pay | Source: Ambulatory Visit | Attending: Oncology | Admitting: Oncology

## 2017-07-06 ENCOUNTER — Ambulatory Visit: Payer: Self-pay | Attending: Oncology

## 2017-07-06 VITALS — BP 134/77 | HR 86 | Temp 97.7°F | Ht 64.0 in | Wt 195.0 lb

## 2017-07-06 DIAGNOSIS — Z0000 Encounter for general adult medical examination without abnormal findings: Secondary | ICD-10-CM

## 2017-07-06 NOTE — Progress Notes (Signed)
Subjective:     Patient ID: Jessica Underwood, female   DOB: 09-Mar-1956, 62 y.o.   MRN: [id number]  HPI   Review of Systems     Objective:   Physical Exam  Pulmonary/Chest: Right breast exhibits no inverted nipple, no mass, no nipple discharge, no skin change and no tenderness. Left breast exhibits no inverted nipple, no mass, no nipple discharge, no skin change and no tenderness. Breasts are symmetrical.       Assessment:     62 [facility].  Patient screened, and meets [facility] eligibility.  Patient does not have insurance, Medicare or Medicaid.  Handout given on Affordable Care Act.  Instructed patient on breast self awareness using teach back method. CBE unremarkable. No mass or lump palpated.  Patient having problems with bladder prolapse, pelvic fullness.  Had hysterectomy.  Still has ovaries.  Encouraged her to discuss with primary  Physician.    Plan:     Sent for bilateral screening mammogram.

## 2017-07-07 NOTE — Progress Notes (Signed)
Letter mailed from [facility] to notify of normal mammogram results.  Patient to return in one year for annual screening.  Copy to [facility]. 

## 2018-12-12 DIAGNOSIS — K5732 Diverticulitis of large intestine without perforation or abscess without bleeding: Secondary | ICD-10-CM

## 2018-12-12 HISTORY — DX: Diverticulitis of large intestine without perforation or abscess without bleeding: K57.32

## 2018-12-20 ENCOUNTER — Other Ambulatory Visit: Payer: Self-pay

## 2018-12-20 ENCOUNTER — Emergency Department (HOSPITAL_COMMUNITY)
Admission: EM | Admit: 2018-12-20 | Discharge: 2018-12-20 | Disposition: A | Discharge status: Home / Self Care | Payer: Medicare HMO | Attending: Emergency Medicine | Admitting: Emergency Medicine

## 2018-12-20 ENCOUNTER — Emergency Department (HOSPITAL_COMMUNITY): Payer: Medicare HMO

## 2018-12-20 ENCOUNTER — Encounter (HOSPITAL_COMMUNITY): Payer: Self-pay

## 2018-12-20 DIAGNOSIS — I10 Essential (primary) hypertension: Secondary | ICD-10-CM | POA: Insufficient documentation

## 2018-12-20 DIAGNOSIS — R109 Unspecified abdominal pain: Secondary | ICD-10-CM | POA: Diagnosis present

## 2018-12-20 DIAGNOSIS — J449 Chronic obstructive pulmonary disease, unspecified: Secondary | ICD-10-CM | POA: Insufficient documentation

## 2018-12-20 DIAGNOSIS — F17210 Nicotine dependence, cigarettes, uncomplicated: Secondary | ICD-10-CM | POA: Insufficient documentation

## 2018-12-20 DIAGNOSIS — E119 Type 2 diabetes mellitus without complications: Secondary | ICD-10-CM | POA: Insufficient documentation

## 2018-12-20 DIAGNOSIS — K529 Noninfective gastroenteritis and colitis, unspecified: Secondary | ICD-10-CM | POA: Insufficient documentation

## 2018-12-20 DIAGNOSIS — R1084 Generalized abdominal pain: Secondary | ICD-10-CM | POA: Diagnosis not present

## 2018-12-20 DIAGNOSIS — N949 Unspecified condition associated with female genital organs and menstrual cycle: Secondary | ICD-10-CM | POA: Diagnosis not present

## 2018-12-20 LAB — COMPREHENSIVE METABOLIC PANEL
ALT: 21 U/L (ref 0–44)
AST: 17 U/L (ref 15–41)
Albumin: 4.1 g/dL (ref 3.5–5.0)
Alkaline Phosphatase: 115 U/L (ref 38–126)
Anion gap: 10 (ref 5–15)
BUN: 18 mg/dL (ref 8–23)
CO2: 23 mmol/L (ref 22–32)
Calcium: 9 mg/dL (ref 8.9–10.3)
Chloride: 103 mmol/L (ref 98–111)
Creatinine, Ser: 0.81 mg/dL (ref 0.44–1.00)
GFR calc Af Amer: >60 mL/min (ref >60)
GFR calc non Af Amer: >60 mL/min (ref >60)
Glucose, Bld: 110 mg/dL — ABNORMAL HIGH (ref 70–99)
Potassium: 3.7 mmol/L (ref 3.5–5.1)
Sodium: 136 mmol/L (ref 135–145)
Total Bilirubin: 0.5 mg/dL (ref 0.3–1.2)
Total Protein: 7.5 g/dL (ref 6.5–8.1)

## 2018-12-20 LAB — URINALYSIS, ROUTINE W REFLEX MICROSCOPIC
Bilirubin Urine: NEGATIVE
Glucose, UA: NEGATIVE mg/dL
Hgb urine dipstick: NEGATIVE
Ketones, ur: NEGATIVE mg/dL
Leukocytes,Ua: NEGATIVE
Nitrite: NEGATIVE
Protein, ur: NEGATIVE mg/dL
Specific Gravity, Urine: 1.006 (ref 1.005–1.030)
pH: 5 (ref 5.0–8.0)

## 2018-12-20 LAB — CBC WITH DIFFERENTIAL/PLATELET
Abs Immature Granulocytes: 0.02 10*3/uL (ref 0.00–0.07)
Basophils Absolute: 0 10*3/uL (ref 0.0–0.1)
Basophils Relative: 1 %
Eosinophils Absolute: 0.2 10*3/uL (ref 0.0–0.5)
Eosinophils Relative: 4 %
HCT: 40.9 % (ref 36.0–46.0)
Hemoglobin: 13.6 g/dL (ref 12.0–15.0)
Immature Granulocytes: 0 %
Lymphocytes Relative: 32 %
Lymphs Abs: 2.1 10*3/uL (ref 0.7–4.0)
MCH: 31.5 pg (ref 26.0–34.0)
MCHC: 33.3 g/dL (ref 30.0–36.0)
MCV: 94.7 fL (ref 80.0–100.0)
Monocytes Absolute: 0.5 10*3/uL (ref 0.1–1.0)
Monocytes Relative: 7 %
Neutro Abs: 3.9 10*3/uL (ref 1.7–7.7)
Neutrophils Relative %: 56 %
Platelets: 243 10*3/uL (ref 150–400)
RBC: 4.32 MIL/uL (ref 3.87–5.11)
RDW: 12.4 % (ref 11.5–15.5)
WBC: 6.8 10*3/uL (ref 4.0–10.5)
nRBC: 0 % (ref 0.0–0.2)

## 2018-12-20 LAB — CLOSTRIDIUM DIFFICILE BY PCR, REFLEXED: Toxigenic C. Difficile by PCR: POSITIVE — AB

## 2018-12-20 LAB — PROTIME-INR
INR: 1 (ref 0.8–1.2)
Prothrombin Time: 12.8 seconds (ref 11.4–15.2)

## 2018-12-20 LAB — LIPASE, BLOOD: Lipase: 48 U/L (ref 11–51)

## 2018-12-20 LAB — C DIFFICILE QUICK SCREEN W PCR REFLEX
C Diff antigen: POSITIVE — AB
C Diff toxin: NEGATIVE

## 2018-12-20 LAB — TYPE AND SCREEN
ABO/RH(D): A POS
Antibody Screen: NEGATIVE

## 2018-12-20 MED ORDER — ONDANSETRON HCL 4 MG/2ML IJ SOLN
4.0000 mg | Freq: Once | INTRAMUSCULAR | Status: AC
  Administered 2018-12-20: 12:00:00 4 mg via INTRAVENOUS
  Filled 2018-12-20: qty 2

## 2018-12-20 MED ORDER — IOHEXOL 300 MG/ML  SOLN
100.0000 mL | Freq: Once | INTRAMUSCULAR | Status: AC | PRN
  Administered 2018-12-20: 13:00:00 100 mL via INTRAVENOUS

## 2018-12-20 MED ORDER — CIPROFLOXACIN HCL 500 MG PO TABS: 500.0000 mg | ORAL_TABLET | Freq: Two times a day (BID) | ORAL | 0 refills | Status: DC

## 2018-12-20 MED ORDER — VANCOMYCIN HCL 125 MG PO CAPS: 125.0000 mg | ORAL_CAPSULE | Freq: Four times a day (QID) | ORAL | 0 refills | Status: DC

## 2018-12-20 MED ORDER — SODIUM CHLORIDE 0.9 % IV BOLUS
1000.0000 mL | Freq: Once | INTRAVENOUS | Status: AC
  Administered 2018-12-20: 12:00:00 1000 mL via INTRAVENOUS

## 2018-12-20 NOTE — Discharge Instructions (Addendum)
You were seen in the emergency department today with diarrhea and abdominal discomfort.  We are prescribing an antibiotic for treatment.  Please follow-up with both the gastroenterologist and your primary care physician.  Return to the emergency department with any new or suddenly worsening symptoms.  You do have a cyst on your.  We will call you with the results of your test and tell you which antibiotic to take

## 2018-12-20 NOTE — ED Triage Notes (Signed)
Pt has diverticulitis. Has been having a flare up since last Thursday. Was told by her PCP to come to ER for dehydration. States she is having chills and bad diarrhea.

## 2018-12-20 NOTE — ED Provider Notes (Signed)
The patient did not want to wait any longer for the C. difficile test.  She will be discharged home with both Cipro antibiotic and we will call her and tell her which antibiotic to take   Milton Ferguson, MD 12/20/18 [phone]

## 2018-12-20 NOTE — ED Notes (Addendum)
Pt notified via telephone at 1720 that C. Diff results were back and were positive. Pt instructed to get Vancomycin prescription filled per Dr. Roderic Palau. Pt verbalized understanding.

## 2018-12-20 NOTE — ED Provider Notes (Signed)
Emergency Department Provider Note   I have reviewed the triage vital signs and the nursing notes.   HISTORY  Chief Complaint Abdominal Pain   HPI Jessica Underwood is a 63 y.o. female with PMH of COPD, DM, HTN, and Diverticulitis presents to the emergency department for evaluation of abdominal pain with diarrhea, nausea.  Symptoms have been ongoing for the past 6 days and gradually worsening.  She denies any recent antibiotics or sick contacts.  No recent travel.  No fevers but did have some chills.  She is noticed some dark discoloration to her bowel movements.  She states this does feel somewhat similar to her prior episodes of diverticulitis.  She called her PCP office who referred her to the emergency department for further evaluation.  Her last episode of diverticulitis was several years ago but states that she did require hospitalization at that time.  She had a follow-up colonoscopy but does not currently follow along with gastroenterology as her GI doctor moved.   Past Medical History:  Diagnosis Date   COPD (chronic obstructive pulmonary disease) (HCC)    Diabetes mellitus    Diverticulitis    Hypertension     Patient Active Problem List   Diagnosis Date Noted   Abdominal pain, acute 11/01/2011   Acute diverticulosis 11/01/2011   DM (diabetes mellitus) (HCC) 11/01/2011   Hypertension     Past Surgical History:  Procedure Laterality Date   ABDOMINAL HYSTERECTOMY     APPENDECTOMY     TONSILLECTOMY      Allergies Patient has no known allergies.  Family History  Problem Relation Age of Onset   Breast cancer Neg Hx     Social History Social History   Tobacco Use   Smoking status: Current Every Day Smoker    Packs/day: 0.50    Years: 30.00    Pack years: 15.00    Types: Cigarettes   Smokeless tobacco: Never Used  Substance Use Topics   Alcohol use: No   Drug use: No    Review of Systems  Constitutional: No fever.  Eyes: No visual  changes. ENT: No sore throat. Cardiovascular: Denies chest pain. Respiratory: Denies shortness of breath. Gastrointestinal: Positive diffuse abdominal pain.  No nausea, no vomiting. Positive diarrhea.  No constipation. Genitourinary: Negative for dysuria. Musculoskeletal: Negative for back pain. Skin: Negative for rash. Neurological: Negative for headaches, focal weakness or numbness.  10-point ROS otherwise negative.  ____________________________________________   PHYSICAL EXAM:  VITAL SIGNS: ED Triage Vitals [12/20/18 1119]  Enc Vitals Group     BP (!) 153/83     Pulse Rate 84     Resp 16     Temp 98.1 F (36.7 C)     Temp Source Oral     SpO2 100 %     Weight 189 lb (85.7 kg)     Height 5\' 3"  (1.6 m)   Constitutional: Alert and oriented. Well appearing and in no acute distress. Eyes: Conjunctivae are normal.  Head: Atraumatic. Nose: No congestion/rhinnorhea. Mouth/Throat: Mucous membranes are moist.   Neck: No stridor. Cardiovascular: Normal rate, regular rhythm. Good peripheral circulation. Grossly normal heart sounds.   Respiratory: Normal respiratory effort.  No retractions. Lungs CTAB. Gastrointestinal: Soft with mild diffuse tenderness worse in the LLQ. No rebound or guarding. No distention.  Musculoskeletal: No lower extremity tenderness nor edema. No gross deformities of extremities. Neurologic:  Normal speech and language.  Skin:  Skin is warm, dry and intact. No rash noted.  ____________________________________________   LABS (all labs ordered are listed, but only abnormal results are displayed)  Labs Reviewed  C DIFFICILE QUICK SCREEN W PCR REFLEX - Abnormal; Notable for the following components:      Result Value   C Diff antigen POSITIVE (*)    All other components within normal limits  CLOSTRIDIUM DIFFICILE BY PCR, REFLEXED - Abnormal; Notable for the following components:   Toxigenic C. Difficile by PCR POSITIVE (*)    All other components  within normal limits  COMPREHENSIVE METABOLIC PANEL - Abnormal; Notable for the following components:   Glucose, Bld 110 (*)    All other components within normal limits  URINE CULTURE  LIPASE, BLOOD  CBC WITH DIFFERENTIAL/PLATELET  PROTIME-INR  URINALYSIS, ROUTINE W REFLEX MICROSCOPIC  TYPE AND SCREEN   ____________________________________________  RADIOLOGY  Ct Abdomen Pelvis W Contrast  Result Date: 12/20/2018 CLINICAL DATA:  Acute abdominal pain, diarrhea EXAM: CT ABDOMEN AND PELVIS WITH CONTRAST TECHNIQUE: Multidetector CT imaging of the abdomen and pelvis was performed using the standard protocol following bolus administration of intravenous contrast. CONTRAST:  100mL OMNIPAQUE IOHEXOL 300 MG/ML  SOLN COMPARISON:  05/09/2014 FINDINGS: Lower chest: No acute abnormality. Hepatobiliary: Multiple gallstones layer dependently within a nondilated gallbladder. No evidence of wall thickening or inflammatory changes. Liver unremarkable without focal lesion. Pancreas: Unremarkable. No pancreatic ductal dilatation or surrounding inflammatory changes. Spleen: Normal in size without focal abnormality. Adrenals/Urinary Tract: 8 mm low-density region within the superior pole of the left kidney, too small to definitively characterize, but most likely represents cyst. Kidneys are otherwise unremarkable. No hydronephrosis. Ureters nondilated. Urinary bladder unremarkable. Stomach/Bowel: Small hiatal hernia. Mild circumferential wall thickening of the duodenum and proximal jejunum. No dilated loops of bowel. Extensive colonic diverticulosis. Zuley Lutter segment bowel wall thickening of the sigmoid colon without pericolonic inflammatory changes. Appendix not visualized, and may be surgically absent. Vascular/Lymphatic: No significant vascular findings are present. No enlarged abdominal or pelvic lymph nodes. Reproductive: Interval increase in size of right adnexal cystic lesion measuring 3.6 x 2.9 cm (previously 2.7 x  1.9 cm) uterus is absent. Other: No ascites.  No abdominal wall hernia. Musculoskeletal: No acute or significant osseous findings. IMPRESSION: 1. Circumferential bowel wall thickening of the duodenum and proximal jejunum suggesting a nonspecific enteritis. No bowel obstruction. 2. Extensive sigmoid diverticulosis with Chadwick Reiswig segment wall thickening, however no pericolonic inflammatory stranding. Findings are favored to represent sequela of chronic diverticulitis without acute diverticulitis. 3. Cholelithiasis. 4. Slight interval increase in size of a right adnexal cystic lesion. Further evaluation with a nonemergent pelvic ultrasound is recommended. 5. Small hiatal hernia. Electronically Signed   By: Duanne GuessNicholas  Plundo M.D.   On: 12/20/2018 13:46    ____________________________________________   PROCEDURES  Procedure(s) performed:   Procedures  None ____________________________________________   INITIAL IMPRESSION / ASSESSMENT AND PLAN / ED COURSE  Pertinent labs & imaging results that were available during my care of the patient were reviewed by me and considered in my medical decision making (see chart for details).   Patient presents to the emergency department for evaluation of abdominal pain with diarrhea.  Some dark discoloration to the bowel movements concerning for blood according to the patient.  She has mild diffuse abdominal tenderness with focal tenderness in the left lower quadrant.  Afebrile here with normal vital signs.  Plan for CT imaging to evaluate for complicated diverticulitis.  Will give IV fluids, Zofran.  Plan to send C. difficile testing if possible to obtain sample.   CT  with non-specific enteritis pattern. Likely chronic diverticulitis without acute findings. Patient with equivocal c. Diff testing being sent for PCR. If positive, patient would be a good candidate for oral Vancomycin at home. If negative for C. Diff, would consider alternate abx and symptom mgmt with close  GI follow up.   Care transferred to Dr. Roderic Palau pending C. Diff PCR.  ____________________________________________  FINAL CLINICAL IMPRESSION(S) / ED DIAGNOSES  Final diagnoses:  Generalized abdominal pain  Enteritis  Adnexal cyst     MEDICATIONS GIVEN DURING THIS VISIT:  Medications  sodium chloride 0.9 % bolus 1,000 mL (0 mLs Intravenous Stopped 12/20/18 1538)  ondansetron (ZOFRAN) injection 4 mg (4 mg Intravenous Given 12/20/18 1213)  iohexol (OMNIPAQUE) 300 MG/ML solution 100 mL (100 mLs Intravenous Contrast Given 12/20/18 1304)     NEW OUTPATIENT MEDICATIONS STARTED DURING THIS VISIT:  Discharge Medication List as of 12/20/2018  4:01 PM    START taking these medications   Details  ciprofloxacin (CIPRO) 500 MG tablet Take 1 tablet (500 mg total) by mouth 2 (two) times daily. One po bid x 7 days, Starting Wed 12/20/2018, Print    vancomycin (VANCOCIN) 125 MG capsule Take 1 capsule (125 mg total) by mouth 4 (four) times daily., Starting Wed 12/20/2018, Print        Note:  This document was prepared using Dragon voice recognition software and may include unintentional dictation errors.  Nanda Quinton, MD Emergency Medicine    Adden Strout, [facility], MD 12/20/18 [phone]

## 2018-12-21 LAB — URINE CULTURE: Special Requests: NORMAL

## 2019-01-10 ENCOUNTER — Encounter: Payer: Self-pay | Admitting: Gastroenterology

## 2019-01-25 ENCOUNTER — Other Ambulatory Visit: Payer: Self-pay

## 2019-01-25 ENCOUNTER — Telehealth: Payer: Self-pay

## 2019-01-25 ENCOUNTER — Encounter: Payer: Self-pay | Admitting: *Deleted

## 2019-01-25 ENCOUNTER — Ambulatory Visit: Payer: Medicare HMO | Admitting: Gastroenterology

## 2019-01-25 ENCOUNTER — Encounter: Payer: Self-pay | Admitting: Gastroenterology

## 2019-01-25 DIAGNOSIS — R197 Diarrhea, unspecified: Secondary | ICD-10-CM | POA: Diagnosis not present

## 2019-01-25 DIAGNOSIS — R1310 Dysphagia, unspecified: Secondary | ICD-10-CM

## 2019-01-25 MED ORDER — PROMETHAZINE HCL 12.5 MG PO TABS: ORAL_TABLET | ORAL | 3 refills | Status: AC

## 2019-01-25 MED ORDER — PEG 3350-KCL-NA BICARB-NACL 420 G PO SOLR: 4000.0000 mL | ORAL | 0 refills | Status: DC

## 2019-01-25 NOTE — Progress Notes (Signed)
Subjective:    Patient ID: Jessica Underwood, female    DOB: 04-Feb-1956, 63 y.o.   MRN: [id number]  Smith Robert, MD  HPI HAD BAD NERVES AND CAUSES HER STOMACH TO HURT A LOT. STAYS NAUSEATED AND SICK AND SOMETIMES CAN'T MAKE IT TO THE BATHROOM. TRIED MEDS ONE TIME FOR IBS AND I T MADE HER SICK AS A DOG AND CONSTIPATED. TAKING ONDANSETRON FOR NAUSEA. IF TAKES A WHOLE ONE IT'S SLEEPY AND HALF ONE DOESN'T WORK. LAST TCS 2016. GOTTA PAY $245 FOR NEXT COLONOSCOPY. STAYS NAUSEATED AND SICK, EPISODIC. BMs: 6 TODAY(#6). WITH C DIFF IT WAS WATERY. FINISHED VANC: LAST SAT. STOMACH PAIN ALL OVER IN MIDDLE STOMACH. NO RADIATION. WEIGHT STABLE: 191 LBS. SEES BLOOD WHEN HER BOTTOM IS RAW FROM WIPING. SOB A WHOLE LOT DUE TO COPD. FEELS LIKE LIQUIDS DON'T GO DOWN RIGHT. DRINKS DIET DR. PEPPER A LOT. NEEDS SOMETHING FOR NAUSEA PRIOR TO GETTING ANESTHESIA. SMOKES : 1/2 PK/DAY BECAUSE HER NERVES ARE BAD (FAMILY PROBLEMS). WELL WATER: YES ABX: VANCOMYCIN PO, TRAVEL: NO  PT DENIES FEVER, CHILLS, HEMATOCHEZIA, HEMATEMESIS, vomiting, melena, CHEST PAIN,  CHANGE IN BOWEL IN HABITS, constipation, OR heartburn or indigestion.  Past Medical History:  Diagnosis Date  . Colon polyps   . COPD (chronic obstructive pulmonary disease) (HCC)   . Diabetes mellitus   . Diverticulitis   . Hypertension    Past Surgical History:  Procedure Laterality Date  . ABDOMINAL HYSTERECTOMY    . APPENDECTOMY    . TONSILLECTOMY     No Known Allergies  Current Outpatient Medications  Medication Sig    . albuterol CG/ACT inhaler Inhale 1-2 puffs into the lungs  Q6H PRN for wheezing or shortness of breath.    Marland Kitchen LIPITOR 40 MG tablet Take 20 mg by mouth daily.     Marland Kitchen KLONOPIN 1 MG tablet Take 1 mg bid as needed.     Marland Kitchen FLONASE 50 MCG/ACT nasal spray Place 2 sprays into both nostrils daily.    Marland Kitchen lisinopril (ZESTRIL) 5 MG tablet Take 1 tablet by mouth daily.    Marland Kitchen GLUCOPHAGE 500 MG tablet Take 1 tablet by mouth daily.    Marland Kitchen ZOFRAN 4 MG  tablet Take 4-8 mg q6h for nausea or vomiting.    Marland Kitchen rOPINIRole (REQUIP) 1 MG tablet Take 1 tablet by mouth daily as needed.    Marland Kitchen SYNTHROID 100 MCG tablet Take 1 tablet by mouth daily.    Marland Kitchen SPIRIVA 18 MCG inhalation capsule Place 18 mcg into inhaler and inhale daily.     ULTRAM 50 MG tablet Take 1 tablet QID PRN.    .      . tiZANidine (ZANAFLEX) 4 MG tablet Take 1 tablet TID PRN    .       Family History  Problem Relation Age of Onset  . Stomach cancer Mother        AGE > 60  . Colon cancer Father        AGE > 60  . Breast cancer Neg Hx   . Colon polyps Neg Hx     Social History   Socioeconomic History  . Marital status: Married    Spouse name: Not on file  . Number of children: Not on file  . Years of education: Not on file  . Highest education level: Not on file  Occupational History  . Not on file  Social Needs  . Financial resource strain: Not on file  . Food insecurity  Worry: Not on file    Inability: Not on file  . Transportation needs    Medical: Not on file    Non-medical: Not on file  Tobacco Use  . Smoking status: Current Every Day Smoker    Packs/day: 0.50    Years: 30.00    Pack years: 15.00    Types: Cigarettes  . Smokeless tobacco: Never Used  Substance and Sexual Activity  . Alcohol use: No  . Drug use: No  . Sexual activity: Yes    Birth control/protection: Surgical  Lifestyle  . Physical activity    Days per week: Not on file    Minutes per session: Not on file  . Stress: Not on file  Relationships  . Social Herbalist on phone: Not on file    Gets together: Not on file    Attends religious service: Not on file    Active member of club or organization: Not on file    Attends meetings of clubs or organizations: Not on file    Relationship status: Not on file  Other Topics Concern  . Not on file  Social History Narrative   MARRIED [phone]). 2 IRSW([id number], 1982). 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    Objective:   Physical Exam Constitutional:      General: She is not in acute distress.    Appearance: Normal appearance.  HENT:     Mouth/Throat:     Comments: MASK IN PLACE Eyes:     General: No scleral icterus.    Pupils: Pupils are equal, round, and reactive to light.  Neck:     Musculoskeletal: Normal range of motion.  Cardiovascular:     Rate and Rhythm: Normal rate and regular rhythm.     Pulses: Normal pulses.     Heart sounds: Normal heart sounds.  Pulmonary:     Effort: Pulmonary effort is normal.     Breath sounds: Normal breath sounds.  Abdominal:     General: Bowel sounds are normal.     Palpations: Abdomen is soft.     Tenderness: There is abdominal tenderness. There is no guarding or rebound.     Comments: MILD RLQ TTP  Musculoskeletal:     Right lower leg: No edema.     Left lower leg: No edema.  Lymphadenopathy:     Cervical: No cervical adenopathy.  Skin:    General: Skin is warm and dry.  Neurological:     Mental Status: She is alert and oriented to person, place, and time.     Comments: NO  NEW FOCAL DEFICITS  Psychiatric:        Mood and Affect: Mood normal.     Comments: FLAT AFFECT       Assessment & Plan:

## 2019-01-25 NOTE — Telephone Encounter (Signed)
PA for EGD submitted via [employer] website. Authorization will be sent to office. Tracking# [id number].

## 2019-01-25 NOTE — Telephone Encounter (Signed)
Pt needs TSH, TTGIgA drawn with pre-op labs prior to procedure. Orders entered. Endo scheduler informed.

## 2019-01-25 NOTE — Patient Instructions (Addendum)
COMPLETE STOOL STUDY.  USE PHENERGAN 30 MINS PRIOR TO MEALS OR AS NEEDED TO HELP CONTROL NAUSEA OR VOMITING.  TO CONTROL DIARRHEA, TRY VIBERZI. TAKE ONE TABLET ONCE OR TWICE DAILY. USE IMODIUM 1 OR 2 TIMES A DAY IF NEEDED.  YOU MUST CALL IF YOU GO MORE THAN 4 DAYS WITHOUT A BOWEL MOVEMENT OR YOU DEVELOP INCREASE IN NAUSEA, VOMITING, OR ABDOMINAL PAIN.  IF VIBERZI WORKS BETTER THAN LOTRENEX, CALL ME FOR A PRESCRIPTION.   COMPLETE COLONOSCOPY AND UPPER ENDOSCOPY TO STRETCH YOUR ESOPHAGUS IN 2-3 MOS. YOU MAY BRING THE ENEMA TO ADMINISTER IN THE PREOP AREA.  FOLLOW UP IN 4 MOS.

## 2019-01-25 NOTE — Assessment & Plan Note (Signed)
SYMPTOMS NOT CONTROLLED AND EXACERBATED BY C DIFF COLITIS. DIFFERENTIAL DIAGNOSIS INCLUDES: IBS-D, PANCREATIC OR BILE SALT INSUFFICIENCY, MICROSCOPIC COLITIS. LESS LIKELY CELIAC SPRUE, THYROID DISTURBANCE, GIARDIASIS, OR IBD.  USE PHENERGAN 30 MINS PRIOR TO MEALS OR AS NEEDED TO HELP CONTROL NAUSEA OR VOMITING. TO CONTROL DIARRHEA, TRY VIBERZI. TAKE ONE TABLET ONCE OR TWICE DAILY. USE IMODIUM 1 OR 2 TIMES A DAY IF NEEDED. YOU MUST CALL IF YOU GO MORE THAN 4 DAYS WITHOUT A BOWEL MOVEMENT OR YOU DEVELOP INCREASE IN NAUSEA, VOMITING, OR ABDOMINAL PAIN. IF VIBERZI WORKS BETTER THAN LOTRENEX, CALL ME FOR A PRESCRIPTION. COMPLETE COLONOSCOPY AND UPPER ENDOSCOPY TO STRETCH YOUR ESOPHAGUS IN 2-3 MOS. YOU MAY BRING THE ENEMA TO ADMINISTER IN THE PREOP AREA. DISCUSSED PROCEDURE, BENEFITS, & RISKS: < 1% chance of medication reaction, bleeding, perforation, ASPIRATION, or rupture of spleen/liver requiring surgery to fix it and missed polyps < 1 cm 10-20% of the time.  FOLLOW UP IN 4 MOS.

## 2019-01-26 NOTE — Progress Notes (Signed)
ON RECALL  °

## 2019-02-01 ENCOUNTER — Other Ambulatory Visit: Payer: Self-pay

## 2019-02-01 NOTE — Telephone Encounter (Signed)
Received fax from [facility], EGD approved. [employer]# [id number], valid 04/16/19-05/16/19.

## 2019-03-06 ENCOUNTER — Encounter: Payer: Self-pay | Admitting: *Deleted

## 2019-03-06 ENCOUNTER — Telehealth: Payer: Self-pay | Admitting: *Deleted

## 2019-03-06 NOTE — Telephone Encounter (Signed)
Called pt and informed her that we needed to change her Covid screening to 04/14/2019.  Pt is aware that I will mail her an updated appointment confirmation letter.  Pt is aware of new appt date and time and voiced understanding.

## 2019-03-28 ENCOUNTER — Telehealth: Payer: Self-pay | Admitting: Gastroenterology

## 2019-03-28 NOTE — Telephone Encounter (Signed)
[phone] PATIENT CALLED TO CANCEL HER PROCEDURE

## 2019-03-28 NOTE — Telephone Encounter (Signed)
Called pt. She states she needs to cancel everything for now because of what is going on and d/t financial reasons. I called endo and made aware. FYI to [facility].

## 2019-03-29 NOTE — Telephone Encounter (Signed)
REVIEWED-NO ADDITIONAL RECOMMENDATIONS. 

## 2019-04-12 ENCOUNTER — Other Ambulatory Visit (HOSPITAL_COMMUNITY): Payer: Medicare HMO

## 2019-04-14 ENCOUNTER — Other Ambulatory Visit (HOSPITAL_COMMUNITY): Payer: Medicare HMO

## 2019-04-16 ENCOUNTER — Ambulatory Visit (HOSPITAL_COMMUNITY): Admit: 2019-04-16 | Payer: Medicare HMO | Admitting: Gastroenterology

## 2019-04-16 ENCOUNTER — Encounter (HOSPITAL_COMMUNITY): Payer: Self-pay

## 2019-04-16 SURGERY — COLONOSCOPY WITH PROPOFOL
Anesthesia: Monitor Anesthesia Care

## 2019-05-15 ENCOUNTER — Encounter: Payer: Self-pay | Admitting: Gastroenterology

## 2019-09-03 IMAGING — MG MM DIGITAL SCREENING BILAT W/ TOMO W/ CAD
8 of 12 series · 8 of 28 positions shown · non-contrast
Comparison: Previous exam(s).

CLINICAL DATA: Screening.

EXAM:
DIGITAL SCREENING BILATERAL MAMMOGRAM WITH TOMO AND CAD

[R CC synth-2D]
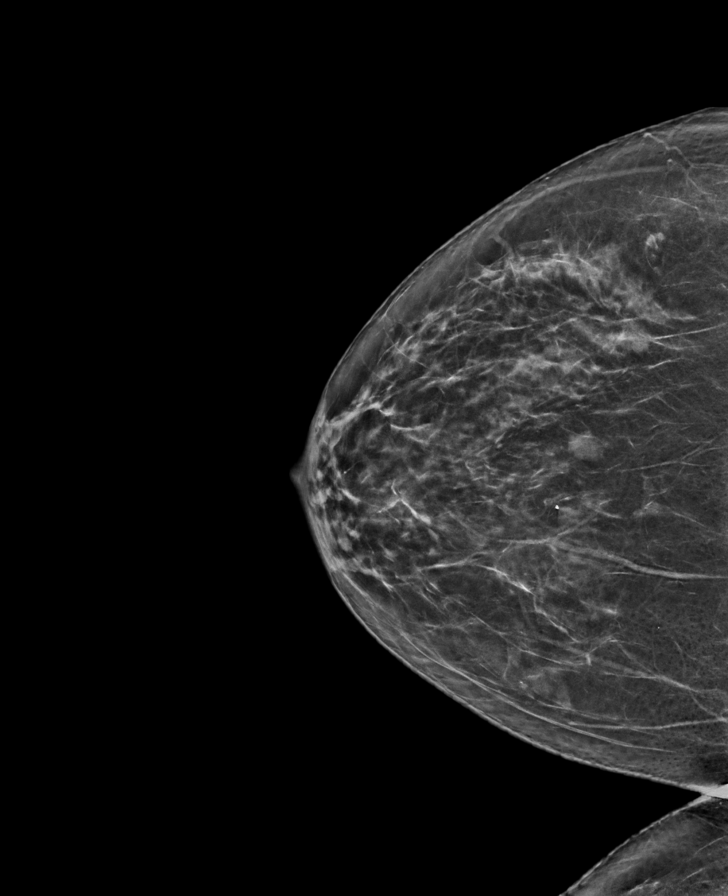

[L MLO]
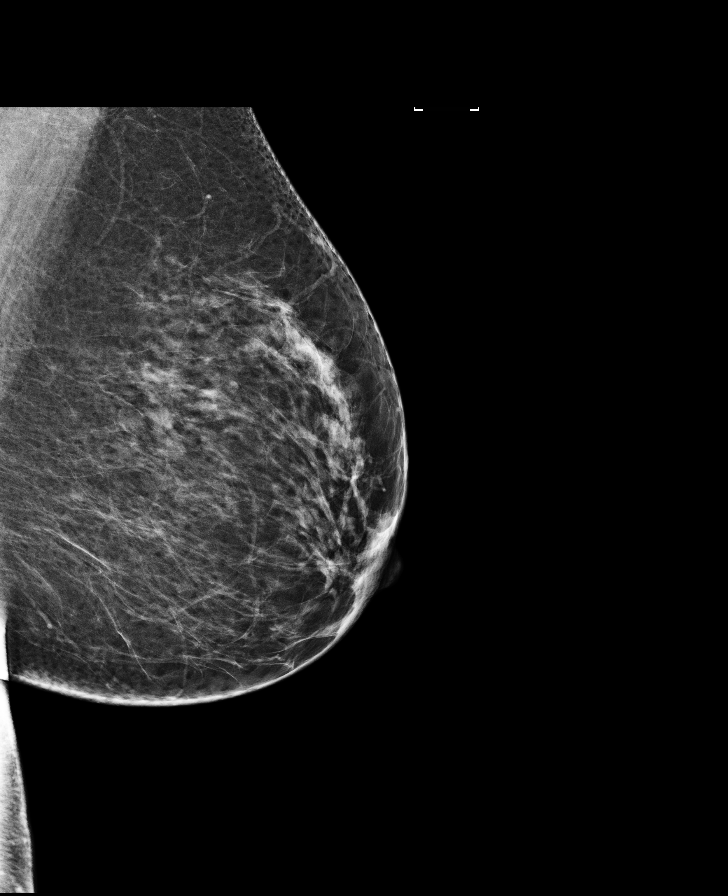

[L CC synth-2D]
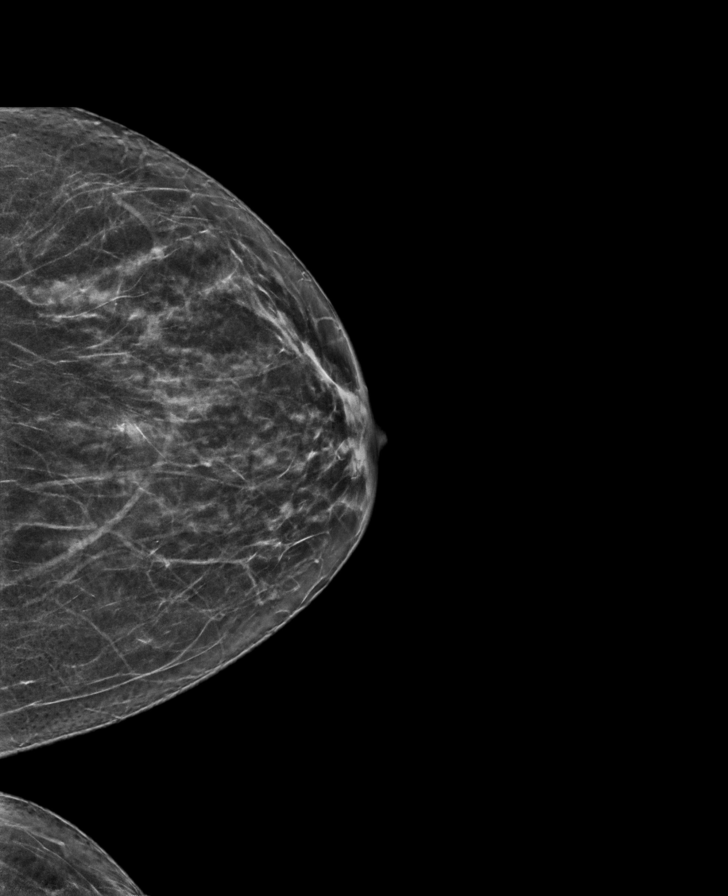

[R CC]
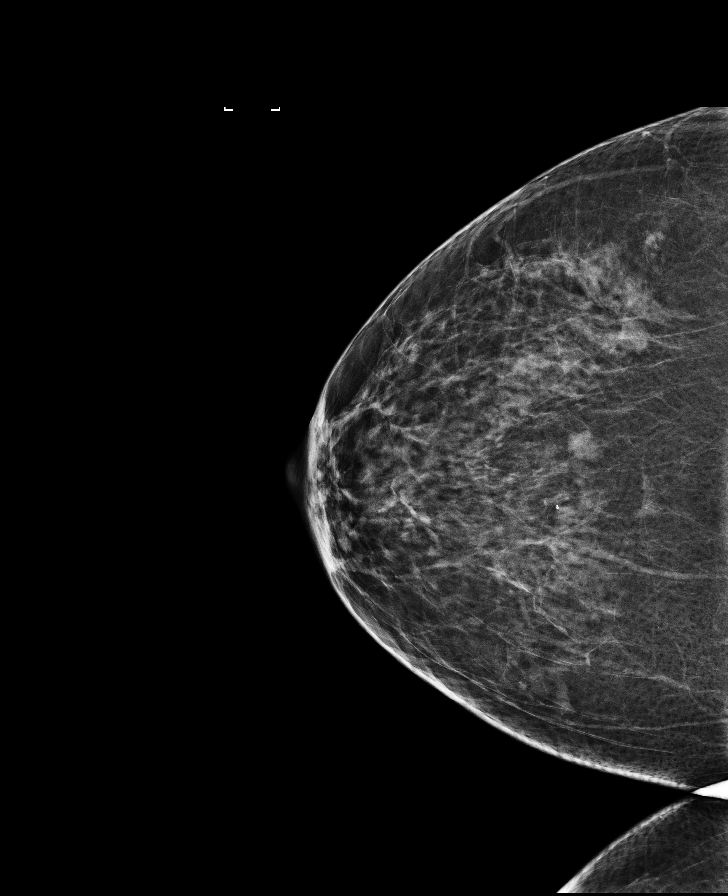

[R MLO]
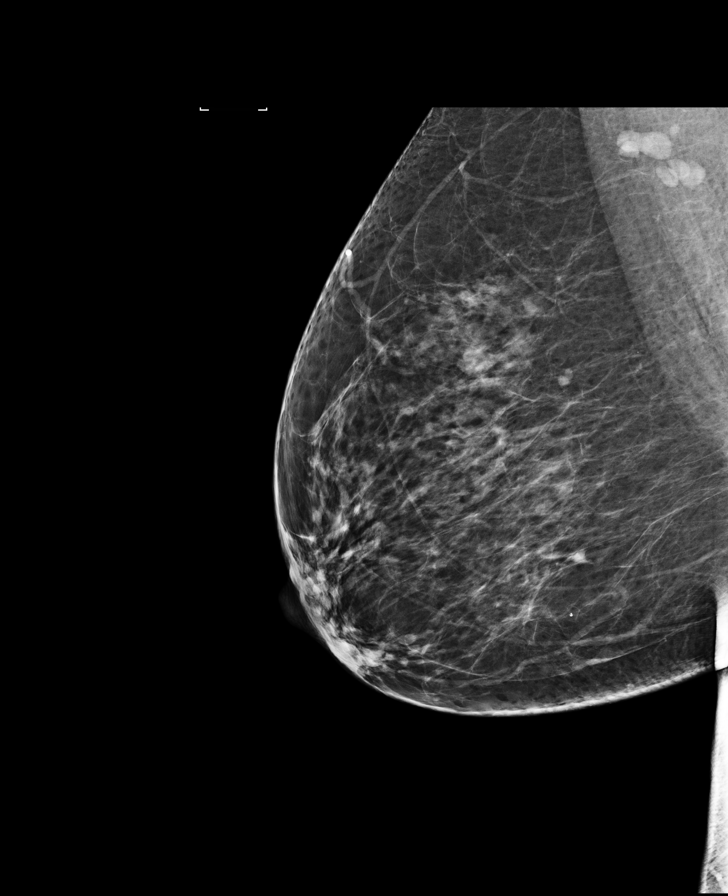

[R MLO synth-2D]
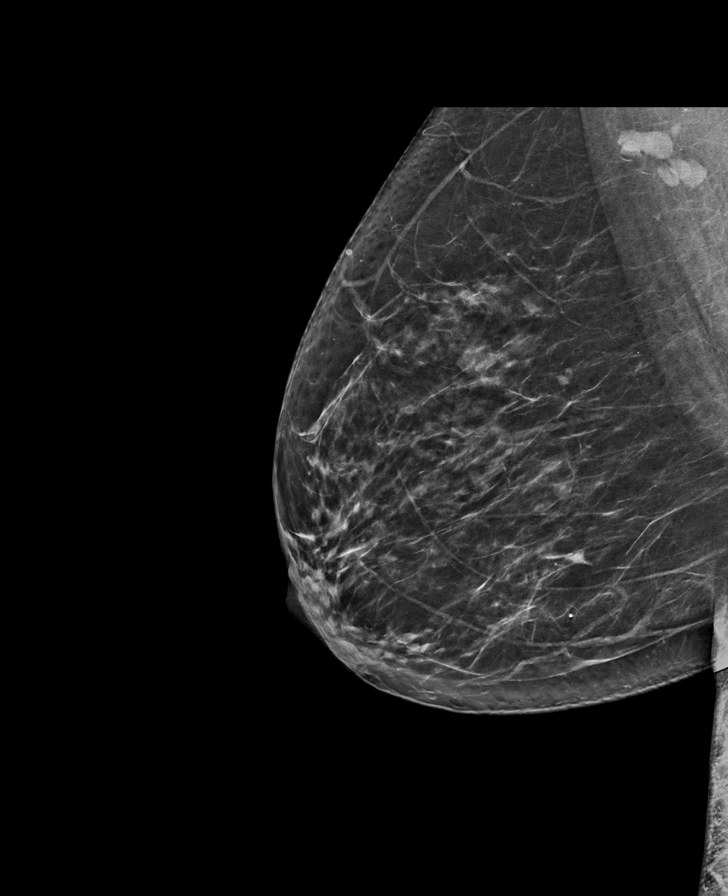

[L CC]
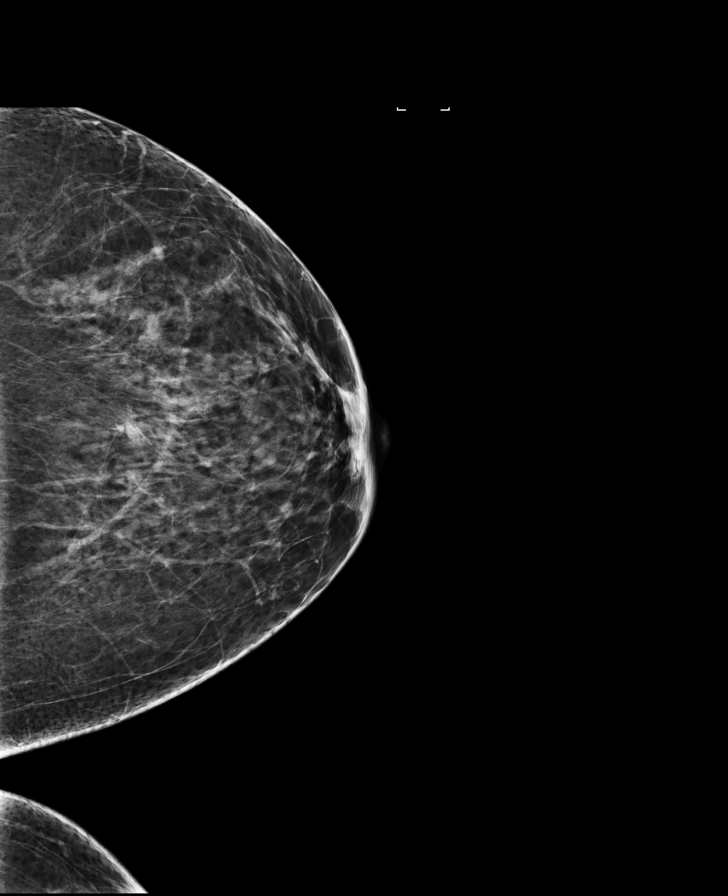

[L MLO synth-2D]
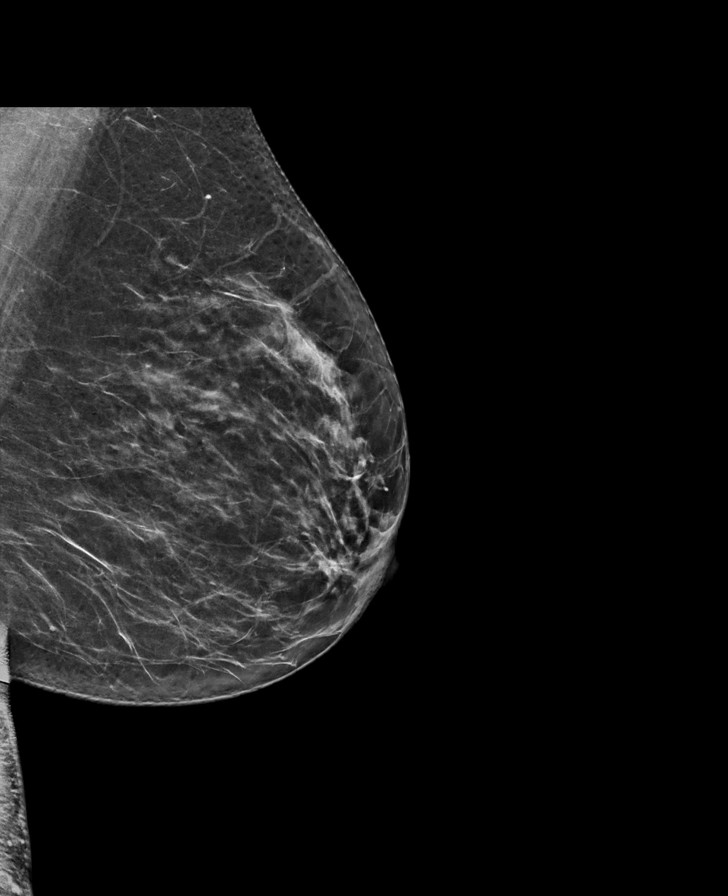

[8 of 28 positions shown; findings below may reference images not displayed]

ACR Breast Density Category c: The breast tissue is heterogeneously
dense, which may obscure small masses.
FINDINGS: There are no findings suspicious for malignancy. Images were
processed with CAD.
IMPRESSION: No mammographic evidence of malignancy. A result letter of this
screening mammogram will be mailed directly to the patient.

RECOMMENDATION:
Screening mammogram in one year. (Code:[id number])

BI-RADS CATEGORY  1: Negative.

## 2020-07-17 ENCOUNTER — Encounter: Payer: Self-pay | Admitting: Ophthalmology

## 2020-07-17 NOTE — Anesthesia Preprocedure Evaluation (Addendum)
Anesthesia Evaluation  Patient identified by MRN, date of birth, ID band Patient awake    History of Anesthesia Complications (+) PONV and history of anesthetic complications  Airway Mallampati: III  TM Distance: >3 FB Neck ROM: Full    Dental  (+) Upper Dentures, Lower Dentures   Pulmonary COPD, former smoker,    Pulmonary exam normal        Cardiovascular hypertension, Pt. on medications Normal cardiovascular exam     Neuro/Psych negative neurological ROS     GI/Hepatic negative GI ROS, Neg liver ROS,   Endo/Other  diabetes, Type 2Hypothyroidism BMI 36  Renal/GU      Musculoskeletal   Abdominal   Peds  Hematology   Anesthesia Other Findings   Reproductive/Obstetrics                            Anesthesia Physical Anesthesia Plan  ASA: II  Anesthesia Plan: MAC   Post-op Pain Management:    Induction:   PONV Risk Score and Plan: 3 and TIVA, Midazolam and Treatment may vary due to age or medical condition  Airway Management Planned: Nasal Cannula and Natural Airway  Additional Equipment: None  Intra-op Plan:   Post-operative Plan:   Informed Consent: I have reviewed the patients History and Physical, chart, labs and discussed the procedure including the risks, benefits and alternatives for the proposed anesthesia with the patient or authorized representative who has indicated his/her understanding and acceptance.       Plan Discussed with: CRNA  Anesthesia Plan Comments:         Anesthesia Quick Evaluation

## 2020-07-23 NOTE — Discharge Instructions (Signed)

## 2020-07-25 ENCOUNTER — Encounter
Admission: RE | Disposition: A | Discharge status: Home / Self Care | Payer: Self-pay | Source: Home / Self Care | Attending: Ophthalmology

## 2020-07-25 ENCOUNTER — Ambulatory Visit: Payer: Medicare HMO | Admitting: Anesthesiology

## 2020-07-25 ENCOUNTER — Other Ambulatory Visit: Payer: Self-pay

## 2020-07-25 ENCOUNTER — Encounter: Payer: Self-pay | Admitting: Ophthalmology

## 2020-07-25 ENCOUNTER — Ambulatory Visit
Admission: RE | Admit: 2020-07-25 | Discharge: 2020-07-25 | Disposition: A | Discharge status: Home / Self Care | Payer: Medicare HMO | Attending: Ophthalmology | Admitting: Ophthalmology

## 2020-07-25 DIAGNOSIS — Z87891 Personal history of nicotine dependence: Secondary | ICD-10-CM | POA: Insufficient documentation

## 2020-07-25 DIAGNOSIS — Z90710 Acquired absence of both cervix and uterus: Secondary | ICD-10-CM | POA: Diagnosis not present

## 2020-07-25 DIAGNOSIS — H2511 Age-related nuclear cataract, right eye: Secondary | ICD-10-CM | POA: Diagnosis not present

## 2020-07-25 DIAGNOSIS — Z7982 Long term (current) use of aspirin: Secondary | ICD-10-CM | POA: Insufficient documentation

## 2020-07-25 DIAGNOSIS — I10 Essential (primary) hypertension: Secondary | ICD-10-CM | POA: Insufficient documentation

## 2020-07-25 DIAGNOSIS — Z86010 Personal history of colonic polyps: Secondary | ICD-10-CM | POA: Insufficient documentation

## 2020-07-25 DIAGNOSIS — Z79899 Other long term (current) drug therapy: Secondary | ICD-10-CM | POA: Diagnosis not present

## 2020-07-25 DIAGNOSIS — E1136 Type 2 diabetes mellitus with diabetic cataract: Secondary | ICD-10-CM | POA: Insufficient documentation

## 2020-07-25 DIAGNOSIS — Z7984 Long term (current) use of oral hypoglycemic drugs: Secondary | ICD-10-CM | POA: Insufficient documentation

## 2020-07-25 HISTORY — DX: Dyspnea, unspecified: R06.00

## 2020-07-25 HISTORY — PX: CATARACT EXTRACTION W/PHACO: SHX586

## 2020-07-25 HISTORY — DX: Motion sickness, initial encounter: T75.3XXA

## 2020-07-25 HISTORY — DX: Presence of dental prosthetic device (complete) (partial): K08.109

## 2020-07-25 HISTORY — DX: Presence of external hearing-aid: Z97.4

## 2020-07-25 HISTORY — DX: Unspecified osteoarthritis, unspecified site: M19.90

## 2020-07-25 HISTORY — DX: Other specified postprocedural states: Z98.890

## 2020-07-25 HISTORY — DX: Nausea with vomiting, unspecified: R11.2

## 2020-07-25 SURGERY — PHACOEMULSIFICATION, CATARACT, WITH IOL INSERTION
Anesthesia: Monitor Anesthesia Care | Site: Eye | Laterality: Right

## 2020-07-25 MED ORDER — FENTANYL CITRATE (PF) 100 MCG/2ML IJ SOLN
INTRAMUSCULAR | Status: DC | PRN
  Administered 2020-07-25 (×2): 50 ug via INTRAVENOUS

## 2020-07-25 MED ORDER — TETRACAINE HCL 0.5 % OP SOLN: 1.0000 [drp] | OPHTHALMIC | Status: DC | PRN

## 2020-07-25 MED ORDER — MIDAZOLAM HCL 2 MG/2ML IJ SOLN
INTRAMUSCULAR | Status: DC | PRN
  Administered 2020-07-25: 2 mg via INTRAVENOUS

## 2020-07-25 MED ORDER — TETRACAINE HCL 0.5 % OP SOLN
1.0000 [drp] | OPHTHALMIC | Status: DC | PRN
  Administered 2020-07-25 (×3): 1 [drp] via OPHTHALMIC

## 2020-07-25 MED ORDER — ARMC OPHTHALMIC DILATING DROPS
1.0000 "application " | OPHTHALMIC | Status: DC | PRN
  Administered 2020-07-25 (×3): 1 via OPHTHALMIC

## 2020-07-25 MED ORDER — ARMC OPHTHALMIC DILATING DROPS: 1.0000 "application " | OPHTHALMIC | Status: DC | PRN

## 2020-07-25 MED ORDER — LIDOCAINE HCL (PF) 2 % IJ SOLN
INTRAOCULAR | Status: DC | PRN
  Administered 2020-07-25: 1 mL

## 2020-07-25 MED ORDER — CEFUROXIME OPHTHALMIC INJECTION 1 MG/0.1 ML
INJECTION | OPHTHALMIC | Status: DC | PRN
  Administered 2020-07-25: 0.1 mL via INTRACAMERAL

## 2020-07-25 MED ORDER — EPINEPHRINE PF 1 MG/ML IJ SOLN
INTRAOCULAR | Status: DC | PRN
  Administered 2020-07-25: 55 mL via OPHTHALMIC

## 2020-07-25 MED ORDER — BRIMONIDINE TARTRATE-TIMOLOL 0.2-0.5 % OP SOLN
OPHTHALMIC | Status: DC | PRN
  Administered 2020-07-25: 1 [drp] via OPHTHALMIC

## 2020-07-25 MED ORDER — NA HYALUR & NA CHOND-NA HYALUR 0.4-0.35 ML IO KIT
PACK | INTRAOCULAR | Status: DC | PRN
  Administered 2020-07-25: 1 mL via INTRAOCULAR

## 2020-07-25 SURGICAL SUPPLY — 20 items
CANNULA ANT/CHMB 27G (MISCELLANEOUS) ×1 IMPLANT
CANNULA ANT/CHMB 27GA (MISCELLANEOUS) ×2 IMPLANT
GLOVE SURG TRIUMPH 8.0 PF LTX (GLOVE) ×2 IMPLANT
GOWN STRL REUS W/ TWL LRG LVL3 (GOWN DISPOSABLE) ×2 IMPLANT
GOWN STRL REUS W/TWL LRG LVL3 (GOWN DISPOSABLE) ×4
LENS IOL TECNIS EYHANCE 25.5 (Intraocular Lens) ×1 IMPLANT
MARKER SKIN DUAL TIP RULER LAB (MISCELLANEOUS) ×2 IMPLANT
NDL CAPSULORHEX 25GA (NEEDLE) ×1 IMPLANT
NDL FILTER BLUNT 18X1 1/2 (NEEDLE) ×2 IMPLANT
NEEDLE CAPSULORHEX 25GA (NEEDLE) ×2 IMPLANT
NEEDLE FILTER BLUNT 18X 1/2SAF (NEEDLE) ×2
NEEDLE FILTER BLUNT 18X1 1/2 (NEEDLE) ×2 IMPLANT
PACK CATARACT BRASINGTON (MISCELLANEOUS) ×2 IMPLANT
PACK EYE AFTER SURG (MISCELLANEOUS) ×2 IMPLANT
PACK OPTHALMIC (MISCELLANEOUS) ×2 IMPLANT
SOLUTION OPHTHALMIC SALT (MISCELLANEOUS) ×2 IMPLANT
SYR 3ML LL SCALE MARK (SYRINGE) ×4 IMPLANT
SYR TB 1ML LUER SLIP (SYRINGE) ×2 IMPLANT
WATER STERILE IRR 250ML POUR (IV SOLUTION) ×2 IMPLANT
WIPE NON LINTING 3.25X3.25 (MISCELLANEOUS) ×2 IMPLANT

## 2020-07-25 NOTE — Op Note (Signed)
  LOCATION:  [facility]   PREOPERATIVE DIAGNOSIS:    Nuclear sclerotic cataract right eye. H25.11   POSTOPERATIVE DIAGNOSIS:  Nuclear sclerotic cataract right eye.     PROCEDURE:  Phacoemusification with posterior chamber intraocular lens placement of the right eye   ULTRASOUND TIME: Procedure(s) with comments: CATARACT EXTRACTION PHACO AND INTRAOCULAR LENS PLACEMENT (IOC) RIGHT DIABETIC 11.20 01:16.2 14.7% (Right) - COVID + 05-05-20 [facility] WE HAVE COPY IN CHART  LENS:   Implant Name Type Inv. Item Serial No. Manufacturer Lot No. LRB No. Used Action  LENS IOL TECNIS EYHANCE 25.5 - [id number] Intraocular Lens LENS IOL TECNIS EYHANCE 25.5 [id number] JOHNSON   Right 1 Implanted         SURGEON:  Deirdre Evener, MD   ANESTHESIA:  Topical with tetracaine drops and 2% Xylocaine jelly, augmented with 1% preservative-free intracameral lidocaine.    COMPLICATIONS:  None.   DESCRIPTION OF PROCEDURE:  The patient was identified in the holding room and transported to the operating room and placed in the supine position under the operating microscope.  The right eye was identified as the operative eye and it was prepped and draped in the usual sterile ophthalmic fashion.   A 1 millimeter clear-corneal paracentesis was made at the 12:00 position.  0.5 ml of preservative-free 1% lidocaine was injected into the anterior chamber. The anterior chamber was filled with Viscoat viscoelastic.  A 2.4 millimeter keratome was used to make a near-clear corneal incision at the 9:00 position.  A curvilinear capsulorrhexis was made with a cystotome and capsulorrhexis forceps.  Balanced salt solution was used to hydrodissect and hydrodelineate the nucleus.   Phacoemulsification was then used in stop and chop fashion to remove the lens nucleus and epinucleus.  The remaining cortex was then removed using the irrigation and aspiration handpiece. Provisc was then placed into the  capsular bag to distend it for lens placement.  A lens was then injected into the capsular bag.  The remaining viscoelastic was aspirated.   Wounds were hydrated with balanced salt solution.  The anterior chamber was inflated to a physiologic pressure with balanced salt solution.  No wound leaks were noted. Cefuroxime 0.1 ml of a 10mg /ml solution was injected into the anterior chamber for a dose of 1 mg of intracameral antibiotic at the completion of the case.   Timolol and Brimonidine drops and Maxitrol ointment were applied to the eye.  The patient was taken to the recovery room in stable condition without complications of anesthesia or surgery.   Saiquan Hands 07/25/2020, 11:56 AM

## 2020-07-25 NOTE — Anesthesia Postprocedure Evaluation (Signed)
Anesthesia Post Note  Patient: Jessica Underwood  Procedure(s) Performed: CATARACT EXTRACTION PHACO AND INTRAOCULAR LENS PLACEMENT (IOC) RIGHT DIABETIC 11.20 01:16.2 14.7% (Right Eye)     Patient location during evaluation: PACU Anesthesia Type: MAC Level of consciousness: awake and alert Pain management: pain level controlled Vital Signs Assessment: post-procedure vital signs reviewed and stable Respiratory status: spontaneous breathing Cardiovascular status: blood pressure returned to baseline Postop Assessment: no apparent nausea or vomiting, adequate PO intake and no headache Anesthetic complications: no   No complications documented.  Adele Barthel Lynann Demetrius

## 2020-07-25 NOTE — Transfer of Care (Signed)
Immediate Anesthesia Transfer of Care Note  Patient: Jessica Underwood  Procedure(s) Performed: CATARACT EXTRACTION PHACO AND INTRAOCULAR LENS PLACEMENT (IOC) RIGHT DIABETIC 11.20 01:16.2 14.7% (Right Eye)  Patient Location: PACU  Anesthesia Type: MAC  Level of Consciousness: awake, alert  and patient cooperative  Airway and Oxygen Therapy: Patient Spontanous Breathing and Patient connected to supplemental oxygen  Post-op Assessment: Post-op Vital signs reviewed, Patient's Cardiovascular Status Stable, Respiratory Function Stable, Patent Airway and No signs of Nausea or vomiting  Post-op Vital Signs: Reviewed and stable  Complications: No complications documented.

## 2020-07-25 NOTE — H&P (Signed)
[facility]   Primary Care Physician:  Smith Robert, MD Ophthalmologist: Dr. Lockie Mola  Pre-Procedure History & Physical: HPI:  Jessica Underwood is a 65 y.o. female here for ophthalmic surgery.   Past Medical History:  Diagnosis Date  . Arthritis    fingers, back, left shoulder, knees  . Colon polyps 2016  . COPD (chronic obstructive pulmonary disease) (HCC)   . Diabetes mellitus   . Diverticulitis large intestine 12/2018   UNCOMPLICATED  . Diverticulosis   . Dyspnea    with exertion  . Full dentures   . Hearing aid worn    bilateral  . Hypertension   . Motion sickness   . PONV (postoperative nausea and vomiting)     Past Surgical History:  Procedure Laterality Date  . ABDOMINAL HYSTERECTOMY    . APPENDECTOMY    . COLONOSCOPY W/ POLYPECTOMY  2016   COLON POLYPS  . TONSILLECTOMY      Prior to Admission medications   Medication Sig Start Date End Date Taking? Authorizing Provider  acetaminophen (TYLENOL) 650 MG CR tablet Take 650 mg by mouth every 8 (eight) hours as needed for pain.   Yes [provider]  Aspirin-Acetaminophen-Caffeine (EXCEDRIN PO) Take by mouth as needed.   Yes [provider]  ibuprofen (ADVIL) 200 MG tablet Take 200 mg by mouth every 6 (six) hours as needed.   Yes [provider]  rosuvastatin (CRESTOR) 20 MG tablet Take 20 mg by mouth daily.   Yes [provider]  albuterol (VENTOLIN HFA) 108 (90 Base) MCG/ACT inhaler Inhale 1-2 puffs into the lungs every 6 (six) hours as needed for wheezing or shortness of breath.    [provider]  atorvastatin (LIPITOR) 40 MG tablet Take 20 mg by mouth daily.  Patient not taking: Reported on 07/17/2020 09/05/18   [provider]  ciprofloxacin (CIPRO) 500 MG tablet Take 1 tablet (500 mg total) by mouth 2 (two) times daily. One po bid x 7 days Patient not taking: Reported on 01/25/2019 12/20/18   Bethann Berkshire, MD  clonazePAM (KLONOPIN) 1 MG  tablet Take 1 mg by mouth 2 (two) times daily as needed.     [provider]  fluticasone (FLONASE) 50 MCG/ACT nasal spray Place 2 sprays into both nostrils daily. 08/07/18   [provider]  lisinopril (ZESTRIL) 5 MG tablet Take 1 tablet by mouth daily. 11/24/18   [provider]  metFORMIN (GLUCOPHAGE) 500 MG tablet Take 1 tablet by mouth daily. 12/01/18   [provider]  ondansetron (ZOFRAN) 4 MG tablet Take 4-8 mg by mouth every 8 (eight) hours as needed for nausea or vomiting.    [provider]  polyethylene glycol-electrolytes (TRILYTE) 420 g solution Take 4,000 mLs by mouth as directed. Patient not taking: Reported on 07/17/2020 01/25/19   [place] Bali, MD  promethazine (PHENERGAN) 12.5 MG tablet 1/2 to 1 po q4-6 h prn nausea or vomiting 01/25/19   Fields, Darleene Cleaver, MD  rOPINIRole (REQUIP) 1 MG tablet Take 1 tablet by mouth daily as needed. Patient not taking: Reported on 07/17/2020 09/14/18   [provider]  SYNTHROID 100 MCG tablet Take 1 tablet by mouth daily. 12/11/18   [provider]  tiotropium (SPIRIVA) 18 MCG inhalation capsule Place 18 mcg into inhaler and inhale daily. Patient not taking: Reported on 07/17/2020    [provider]  tiZANidine (ZANAFLEX) 4 MG tablet Take 1 tablet by mouth 3 (three) times daily as  needed. Patient not taking: Reported on 07/17/2020    [provider]  traMADol (ULTRAM) 50 MG tablet Take 1 tablet by mouth 4 (four) times daily as needed. 10/11/18   [provider]  vancomycin (VANCOCIN) 125 MG capsule Take 1 capsule (125 mg total) by mouth 4 (four) times daily. Patient not taking: Reported on 01/25/2019 12/20/18   Bethann Berkshire, MD    Allergies as of 06/09/2020  . (No Known Allergies)    Family History  Problem Relation Age of Onset  . Stomach cancer Mother        AGE > 60  . Colon cancer Father        AGE > 60  . Breast cancer Neg Hx   . Colon polyps Neg Hx      Social History   Socioeconomic History  . Marital status: Married    Spouse name: Not on file  . Number of children: Not on file  . Years of education: Not on file  . Highest education level: Not on file  Occupational History  . Not on file  Tobacco Use  . Smoking status: Former Smoker    Packs/day: 0.50    Years: 30.00    Pack years: 15.00    Types: Cigarettes    Quit date: 09/2019    Years since quitting: 0.8  . Smokeless tobacco: Never Used  Vaping Use  . Vaping Use: Never used  Substance and Sexual Activity  . Alcohol use: No  . Drug use: No  . Sexual activity: Yes    Birth control/protection: Surgical  Other Topics Concern  . Not on file  Social History Narrative   MARRIED [phone]). 2 KZLD(3570, 1982). 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 Lungs:  Clear to auscultation.    Heart:  Regular rate and rhythm.   Impression/Plan: Jessica Underwood is here for ophthalmic surgery.  Risks, benefits, limitations, and alternatives regarding ophthalmic surgery have been reviewed with the patient.  Questions have been answered.  All parties agreeable.   Jake Church, MD  07/25/2020, 10:24 AM

## 2020-07-25 NOTE — Anesthesia Procedure Notes (Signed)
Procedure Name: MAC Performed by: Louella Medaglia, CRNA Pre-anesthesia Checklist: Patient identified, Emergency Drugs available, Suction available, Timeout performed and Patient being monitored Patient Re-evaluated:Patient Re-evaluated prior to induction Oxygen Delivery Method: Nasal cannula Placement Confirmation: positive ETCO2       

## 2020-07-28 ENCOUNTER — Encounter: Payer: Self-pay | Admitting: Ophthalmology

## 2020-07-30 ENCOUNTER — Encounter: Payer: Self-pay | Admitting: Ophthalmology

## 2020-07-30 ENCOUNTER — Other Ambulatory Visit: Payer: Self-pay

## 2020-08-04 NOTE — Discharge Instructions (Signed)

## 2020-08-06 ENCOUNTER — Encounter: Payer: Self-pay | Admitting: Ophthalmology

## 2020-08-06 ENCOUNTER — Ambulatory Visit
Admission: RE | Admit: 2020-08-06 | Discharge: 2020-08-06 | Disposition: A | Discharge status: Home / Self Care | Payer: Medicare HMO | Attending: Ophthalmology | Admitting: Ophthalmology

## 2020-08-06 ENCOUNTER — Ambulatory Visit: Payer: Medicare HMO | Admitting: Anesthesiology

## 2020-08-06 ENCOUNTER — Encounter
Admission: RE | Disposition: A | Discharge status: Home / Self Care | Payer: Self-pay | Source: Home / Self Care | Attending: Ophthalmology

## 2020-08-06 DIAGNOSIS — H2512 Age-related nuclear cataract, left eye: Secondary | ICD-10-CM | POA: Insufficient documentation

## 2020-08-06 DIAGNOSIS — Z961 Presence of intraocular lens: Secondary | ICD-10-CM | POA: Diagnosis not present

## 2020-08-06 DIAGNOSIS — Z8616 Personal history of COVID-19: Secondary | ICD-10-CM | POA: Insufficient documentation

## 2020-08-06 DIAGNOSIS — Z79890 Hormone replacement therapy (postmenopausal): Secondary | ICD-10-CM | POA: Insufficient documentation

## 2020-08-06 DIAGNOSIS — Z7984 Long term (current) use of oral hypoglycemic drugs: Secondary | ICD-10-CM | POA: Insufficient documentation

## 2020-08-06 DIAGNOSIS — E1136 Type 2 diabetes mellitus with diabetic cataract: Secondary | ICD-10-CM | POA: Diagnosis not present

## 2020-08-06 DIAGNOSIS — Z9841 Cataract extraction status, right eye: Secondary | ICD-10-CM | POA: Insufficient documentation

## 2020-08-06 DIAGNOSIS — Z87891 Personal history of nicotine dependence: Secondary | ICD-10-CM | POA: Diagnosis not present

## 2020-08-06 HISTORY — PX: CATARACT EXTRACTION W/PHACO: SHX586

## 2020-08-06 LAB — GLUCOSE, CAPILLARY
Glucose-Capillary: 163 mg/dL — ABNORMAL HIGH (ref 70–99)
Glucose-Capillary: 137 mg/dL — ABNORMAL HIGH (ref 70–99)

## 2020-08-06 SURGERY — PHACOEMULSIFICATION, CATARACT, WITH IOL INSERTION
Anesthesia: Monitor Anesthesia Care | Site: Eye | Laterality: Left

## 2020-08-06 MED ORDER — ARMC OPHTHALMIC DILATING DROPS
1.0000 "application " | OPHTHALMIC | Status: DC | PRN
  Administered 2020-08-06 (×3): 1 via OPHTHALMIC

## 2020-08-06 MED ORDER — LIDOCAINE HCL (PF) 2 % IJ SOLN
INTRAOCULAR | Status: DC | PRN
  Administered 2020-08-06: 2 mL

## 2020-08-06 MED ORDER — BRIMONIDINE TARTRATE-TIMOLOL 0.2-0.5 % OP SOLN
OPHTHALMIC | Status: DC | PRN
  Administered 2020-08-06: 1 [drp] via OPHTHALMIC

## 2020-08-06 MED ORDER — CEFUROXIME OPHTHALMIC INJECTION 1 MG/0.1 ML
INJECTION | OPHTHALMIC | Status: DC | PRN
  Administered 2020-08-06: 0.1 mL via INTRACAMERAL

## 2020-08-06 MED ORDER — EPINEPHRINE PF 1 MG/ML IJ SOLN
INTRAOCULAR | Status: DC | PRN
  Administered 2020-08-06: 81 mL via OPHTHALMIC

## 2020-08-06 MED ORDER — LACTATED RINGERS IV SOLN: INTRAVENOUS | Status: DC

## 2020-08-06 MED ORDER — TETRACAINE HCL 0.5 % OP SOLN
1.0000 [drp] | OPHTHALMIC | Status: DC | PRN
  Administered 2020-08-06 (×3): 1 [drp] via OPHTHALMIC

## 2020-08-06 MED ORDER — NA HYALUR & NA CHOND-NA HYALUR 0.4-0.35 ML IO KIT
PACK | INTRAOCULAR | Status: DC | PRN
  Administered 2020-08-06: 1 mL via INTRAOCULAR

## 2020-08-06 MED ORDER — FENTANYL CITRATE (PF) 100 MCG/2ML IJ SOLN
INTRAMUSCULAR | Status: DC | PRN
  Administered 2020-08-06 (×2): 50 ug via INTRAVENOUS

## 2020-08-06 MED ORDER — OXYCODONE HCL 5 MG PO TABS: 5.0000 mg | ORAL_TABLET | Freq: Once | ORAL | Status: DC | PRN

## 2020-08-06 MED ORDER — MIDAZOLAM HCL 2 MG/2ML IJ SOLN
INTRAMUSCULAR | Status: DC | PRN
  Administered 2020-08-06: 2 mg via INTRAVENOUS

## 2020-08-06 MED ORDER — ONDANSETRON HCL 4 MG/2ML IJ SOLN
INTRAMUSCULAR | Status: DC | PRN
  Administered 2020-08-06: 4 mg via INTRAVENOUS

## 2020-08-06 MED ORDER — OXYCODONE HCL 5 MG/5ML PO SOLN: 5.0000 mg | Freq: Once | ORAL | Status: DC | PRN

## 2020-08-06 SURGICAL SUPPLY — 21 items
CANNULA ANT/CHMB 27G (MISCELLANEOUS) ×1 IMPLANT
CANNULA ANT/CHMB 27GA (MISCELLANEOUS) ×2 IMPLANT
GLOVE SURG ENC TEXT LTX SZ7.5 (GLOVE) ×2 IMPLANT
GLOVE SURG TRIUMPH 8.0 PF LTX (GLOVE) ×2 IMPLANT
GOWN STRL REUS W/ TWL LRG LVL3 (GOWN DISPOSABLE) ×2 IMPLANT
GOWN STRL REUS W/TWL LRG LVL3 (GOWN DISPOSABLE) ×4
LENS IOL TECNIS EYHANCE 25.5 (Intraocular Lens) ×1 IMPLANT
MARKER SKIN DUAL TIP RULER LAB (MISCELLANEOUS) ×2 IMPLANT
NDL CAPSULORHEX 25GA (NEEDLE) ×1 IMPLANT
NDL FILTER BLUNT 18X1 1/2 (NEEDLE) ×2 IMPLANT
NEEDLE CAPSULORHEX 25GA (NEEDLE) ×2 IMPLANT
NEEDLE FILTER BLUNT 18X 1/2SAF (NEEDLE) ×2
NEEDLE FILTER BLUNT 18X1 1/2 (NEEDLE) ×2 IMPLANT
PACK CATARACT BRASINGTON (MISCELLANEOUS) ×2 IMPLANT
PACK EYE AFTER SURG (MISCELLANEOUS) ×2 IMPLANT
PACK OPTHALMIC (MISCELLANEOUS) ×2 IMPLANT
SOLUTION OPHTHALMIC SALT (MISCELLANEOUS) ×2 IMPLANT
SYR 3ML LL SCALE MARK (SYRINGE) ×4 IMPLANT
SYR TB 1ML LUER SLIP (SYRINGE) ×2 IMPLANT
WATER STERILE IRR 250ML POUR (IV SOLUTION) ×2 IMPLANT
WIPE NON LINTING 3.25X3.25 (MISCELLANEOUS) ×2 IMPLANT

## 2020-08-06 NOTE — Transfer of Care (Signed)
Immediate Anesthesia Transfer of Care Note  Patient: Jessica Underwood  Procedure(s) Performed: CATARACT EXTRACTION PHACO AND INTRAOCULAR LENS PLACEMENT (IOC) LEFT DIABETIC 11.06 01:36.9 11.4% (Left Eye)  Patient Location: PACU  Anesthesia Type: MAC  Level of Consciousness: awake, alert  and patient cooperative  Airway and Oxygen Therapy: Patient Spontanous Breathing and Patient connected to supplemental oxygen  Post-op Assessment: Post-op Vital signs reviewed, Patient's Cardiovascular Status Stable, Respiratory Function Stable, Patent Airway and No signs of Nausea or vomiting  Post-op Vital Signs: Reviewed and stable  Complications: No complications documented.

## 2020-08-06 NOTE — Op Note (Signed)
  OPERATIVE NOTE  Jessica Underwood [id number] 08/06/2020   PREOPERATIVE DIAGNOSIS:  Nuclear sclerotic cataract left eye. H25.12   POSTOPERATIVE DIAGNOSIS:    Nuclear sclerotic cataract left eye.     PROCEDURE:  Phacoemusification with posterior chamber intraocular lens placement of the left eye  Ultrasound time: Procedure(s) with comments: CATARACT EXTRACTION PHACO AND INTRAOCULAR LENS PLACEMENT (IOC) LEFT DIABETIC 11.06 01:36.9 11.4% (Left) - Diabetic - oral meds  LENS:   Implant Name Type Inv. Item Serial No. Manufacturer Lot No. LRB No. Used Action  LENS IOL TECNIS EYHANCE 25.5 - [id number] Intraocular Lens LENS IOL TECNIS EYHANCE 25.5 [id number] JOHNSON   Left 1 Implanted      SURGEON:  Deirdre Evener, MD   ANESTHESIA:  Topical with tetracaine drops and 2% Xylocaine jelly, augmented with 1% preservative-free intracameral lidocaine.    COMPLICATIONS:  None.   DESCRIPTION OF PROCEDURE:  The patient was identified in the holding room and transported to the operating room and placed in the supine position under the operating microscope.  The left eye was identified as the operative eye and it was prepped and draped in the usual sterile ophthalmic fashion.   A 1 millimeter clear-corneal paracentesis was made at the 1:30 position.  0.5 ml of preservative-free 1% lidocaine was injected into the anterior chamber.  The anterior chamber was filled with Viscoat viscoelastic.  A 2.4 millimeter keratome was used to make a near-clear corneal incision at the 10:30 position.  .  A curvilinear capsulorrhexis was made with a cystotome and capsulorrhexis forceps.  Balanced salt solution was used to hydrodissect and hydrodelineate the nucleus.   Phacoemulsification was then used in stop and chop fashion to remove the lens nucleus and epinucleus.  The remaining cortex was then removed using the irrigation and aspiration handpiece. Provisc was then placed into the capsular bag to distend it for  lens placement.  A lens was then injected into the capsular bag.  The remaining viscoelastic was aspirated.   Wounds were hydrated with balanced salt solution.  The anterior chamber was inflated to a physiologic pressure with balanced salt solution.  No wound leaks were noted. Cefuroxime 0.1 ml of a 10mg /ml solution was injected into the anterior chamber for a dose of 1 mg of intracameral antibiotic at the completion of the case.   Timolol and Brimonidine drops were applied to the eye.  The patient was taken to the recovery room in stable condition without complications of anesthesia or surgery.  [name] 08/06/2020, 8:47 AM

## 2020-08-06 NOTE — H&P (Signed)
[facility]   Primary Care Physician:  Smith Robert, MD Ophthalmologist: Dr. Lockie Mola  Pre-Procedure History & Physical: HPI:  Jessica Underwood is a 65 y.o. female here for ophthalmic surgery.   Past Medical History:  Diagnosis Date  . Arthritis    fingers, back, left shoulder, knees  . Colon polyps 2016  . COPD (chronic obstructive pulmonary disease) (HCC)   . Diabetes mellitus   . Diverticulitis large intestine 12/2018   UNCOMPLICATED  . Diverticulosis   . Dyspnea    with exertion  . Full dentures   . Hearing aid worn    bilateral  . Hypertension   . Motion sickness   . PONV (postoperative nausea and vomiting)     Past Surgical History:  Procedure Laterality Date  . ABDOMINAL HYSTERECTOMY    . APPENDECTOMY    . CATARACT EXTRACTION W/PHACO Right 07/25/2020   Procedure: CATARACT EXTRACTION PHACO AND INTRAOCULAR LENS PLACEMENT (IOC) RIGHT DIABETIC 11.20 01:16.2 14.7%;  Surgeon: Lockie Mola, MD;  Location: [facility];  Service: Ophthalmology;  Laterality: Right;  COVID + 05-05-20 [facility] HAVE COPY IN CHART  . COLONOSCOPY W/ POLYPECTOMY  2016   COLON POLYPS  . TONSILLECTOMY      Prior to Admission medications   Medication Sig Start Date End Date Taking? Authorizing Provider  acetaminophen (TYLENOL) 650 MG CR tablet Take 650 mg by mouth every 8 (eight) hours as needed for pain.   Yes [provider]  Aspirin-Acetaminophen-Caffeine (EXCEDRIN PO) Take by mouth as needed.   Yes [provider]  clonazePAM (KLONOPIN) 1 MG tablet Take 1 mg by mouth 2 (two) times daily as needed.    Yes [provider]  fluticasone (FLONASE) 50 MCG/ACT nasal spray Place 2 sprays into both nostrils daily. 08/07/18  Yes [provider]  ibuprofen (ADVIL) 200 MG tablet Take 200 mg by mouth every 6 (six) hours as needed.   Yes [provider]  lisinopril (ZESTRIL) 5 MG tablet Take 1 tablet by mouth  daily. 11/24/18  Yes [provider]  metFORMIN (GLUCOPHAGE) 500 MG tablet Take 1 tablet by mouth daily. 12/01/18  Yes [provider]  ondansetron (ZOFRAN) 4 MG tablet Take 4-8 mg by mouth every 8 (eight) hours as needed for nausea or vomiting.   Yes [provider]  promethazine (PHENERGAN) 12.5 MG tablet 1/2 to 1 po q4-6 h prn nausea or vomiting 01/25/19  Yes Fields, Sandi L, MD  rosuvastatin (CRESTOR) 20 MG tablet Take 20 mg by mouth daily.   Yes [provider]  SYNTHROID 100 MCG tablet Take 1 tablet by mouth daily. 12/11/18  Yes [provider]  traMADol (ULTRAM) 50 MG tablet Take 1 tablet by mouth 4 (four) times daily as needed. 10/11/18  Yes [provider]  albuterol (VENTOLIN HFA) 108 (90 Base) MCG/ACT inhaler Inhale 1-2 puffs into the lungs every 6 (six) hours as needed for wheezing or shortness of breath.    [provider]    Allergies as of 06/09/2020  . (No Known Allergies)    Family History  Problem Relation Age of Onset  . Stomach cancer Mother        AGE > 60  . Colon cancer Father        AGE > 60  . Breast cancer Neg Hx   . Colon polyps Neg Hx     Social History   Socioeconomic History  . Marital status: Married  Spouse name: Not on file  . Number of children: Not on file  . Years of education: Not on file  . Highest education level: Not on file  Occupational History  . Not on file  Tobacco Use  . Smoking status: Former Smoker    Packs/day: 0.50    Years: 30.00    Pack years: 15.00    Types: Cigarettes    Quit date: 09/2019    Years since quitting: 0.9  . Smokeless tobacco: Never Used  Vaping Use  . Vaping Use: Never used  Substance and Sexual Activity  . Alcohol use: No  . Drug use: No  . Sexual activity: Yes    Birth control/protection: Surgical  Other Topics Concern  . Not on file  Social History Narrative   MARRIED [phone]). 2 WPVX(4801, 1982). 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 Lungs:  Clear to auscultation.    Heart:  Regular rate and rhythm.   Impression/Plan: Jessica Underwood is here for ophthalmic surgery.  Risks, benefits, limitations, and alternatives regarding ophthalmic surgery have been reviewed with the patient.  Questions have been answered.  All parties agreeable.   Jake Church, MD  08/06/2020, 8:07 AM

## 2020-08-06 NOTE — Anesthesia Postprocedure Evaluation (Signed)
Anesthesia Post Note  Patient: Jessica Underwood  Procedure(s) Performed: CATARACT EXTRACTION PHACO AND INTRAOCULAR LENS PLACEMENT (IOC) LEFT DIABETIC 11.06 01:36.9 11.4% (Left Eye)     Patient location during evaluation: PACU Anesthesia Type: MAC Level of consciousness: awake and alert Pain management: pain level controlled Vital Signs Assessment: post-procedure vital signs reviewed and stable Respiratory status: spontaneous breathing, nonlabored ventilation, respiratory function stable and patient connected to nasal cannula oxygen Cardiovascular status: stable and blood pressure returned to baseline Postop Assessment: no apparent nausea or vomiting Anesthetic complications: no   No complications documented.  Fidel Levy

## 2020-08-06 NOTE — Anesthesia Preprocedure Evaluation (Signed)
Anesthesia Evaluation  Patient identified by MRN, date of birth, ID band Patient awake    History of Anesthesia Complications (+) PONV and history of anesthetic complications  Airway Mallampati: II  TM Distance: >3 FB Neck ROM: Full    Dental  (+) Upper Dentures, Lower Dentures   Pulmonary COPD (mild), former smoker,    Pulmonary exam normal        Cardiovascular Exercise Tolerance: Good hypertension, Pt. on medications Normal cardiovascular exam     Neuro/Psych Anxiety negative neurological ROS     GI/Hepatic negative GI ROS, Neg liver ROS,   Endo/Other  diabetes, Type 2Hypothyroidism Morbid obesity (bmi 36)BMI 36  Renal/GU      Musculoskeletal  (+) Arthritis ,   Abdominal   Peds  Hematology negative hematology ROS (+)   Anesthesia Other Findings   Reproductive/Obstetrics                             Anesthesia Physical  Anesthesia Plan  ASA: II  Anesthesia Plan: MAC   Post-op Pain Management:    Induction:   PONV Risk Score and Plan: 3 and TIVA, Midazolam and Treatment may vary due to age or medical condition  Airway Management Planned: Nasal Cannula and Natural Airway  Additional Equipment: None  Intra-op Plan:   Post-operative Plan:   Informed Consent: I have reviewed the patients History and Physical, chart, labs and discussed the procedure including the risks, benefits and alternatives for the proposed anesthesia with the patient or authorized representative who has indicated his/her understanding and acceptance.       Plan Discussed with: CRNA  Anesthesia Plan Comments:         Anesthesia Quick Evaluation

## 2020-08-06 NOTE — Anesthesia Procedure Notes (Signed)
Procedure Name: MAC Date/Time: 08/06/2020 8:32 AM Performed by: Jeannene Patella, CRNA Pre-anesthesia Checklist: Patient identified, Emergency Drugs available, Suction available, Timeout performed and Patient being monitored Patient Re-evaluated:Patient Re-evaluated prior to induction Oxygen Delivery Method: Nasal cannula Placement Confirmation: positive ETCO2

## 2020-08-07 ENCOUNTER — Encounter: Payer: Self-pay | Admitting: Ophthalmology

## 2020-11-27 ENCOUNTER — Other Ambulatory Visit: Payer: Self-pay | Admitting: Emergency Medicine

## 2020-11-27 DIAGNOSIS — R1030 Lower abdominal pain, unspecified: Secondary | ICD-10-CM

## 2020-12-03 ENCOUNTER — Encounter: Payer: Self-pay | Admitting: Internal Medicine

## 2020-12-11 ENCOUNTER — Ambulatory Visit: Admission: RE | Admit: 2020-12-11 | Payer: Medicare HMO | Source: Ambulatory Visit

## 2020-12-26 ENCOUNTER — Ambulatory Visit: Admission: RE | Admit: 2020-12-26 | Payer: Medicare HMO | Source: Ambulatory Visit

## 2021-02-16 IMAGING — CT CT ABD-PELV W/ CM
2 of 5 series · 15 of 46 positions shown, 17 images · IV contrast (omnipaque)
Comparison: 05/09/2014

CLINICAL DATA: Acute abdominal pain, diarrhea

EXAM:
CT ABDOMEN AND PELVIS WITH CONTRAST
TECHNIQUE: Multidetector CT imaging of the abdomen and pelvis was performed
using the standard protocol following bolus administration of
intravenous contrast.
CONTRAST:  100mL OMNIPAQUE IOHEXOL 300 MG/ML  SOLN

[Series 2: axial st · axial · 0.78mm/px · z∈[-828,-443]mm · 12 of 93 slices shown, 14 images]
[im 8/93  soft-tissue]
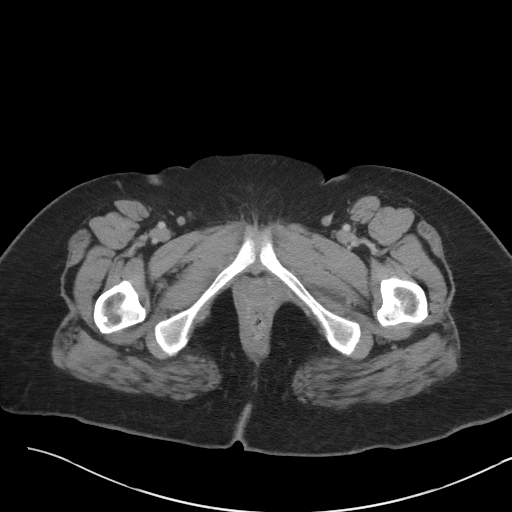
[im 8/93  bone]
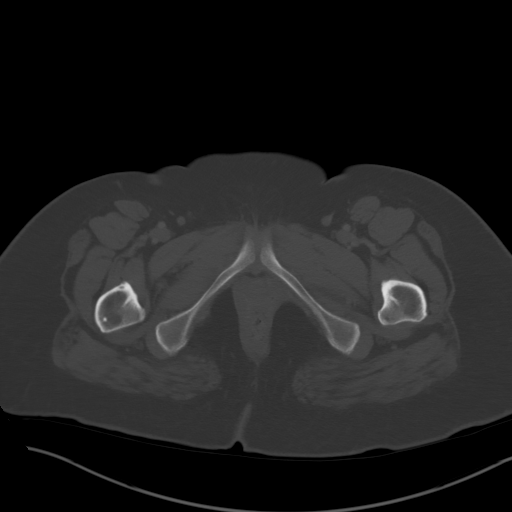
[im 15/93  soft-tissue]
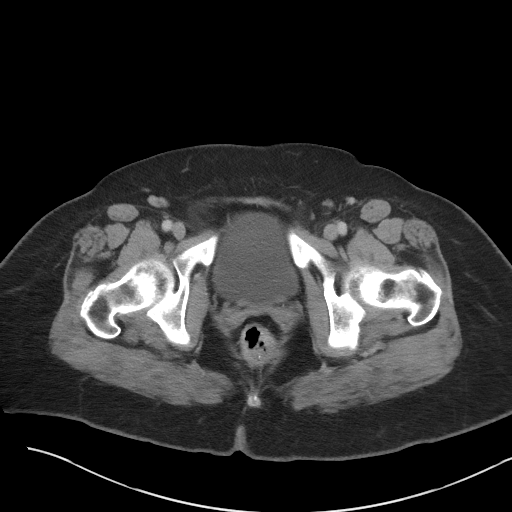
[im 22/93  soft-tissue]
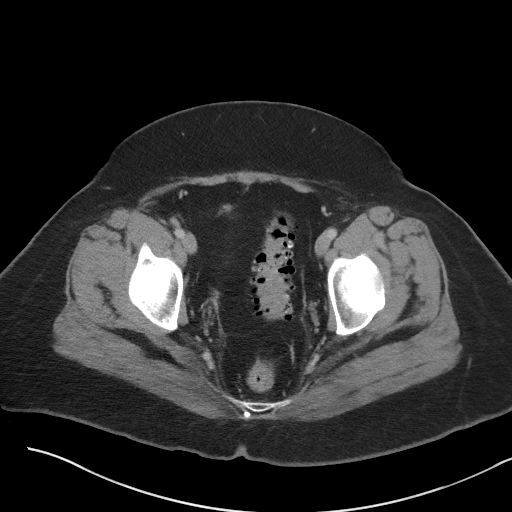
[im 29/93  soft-tissue]
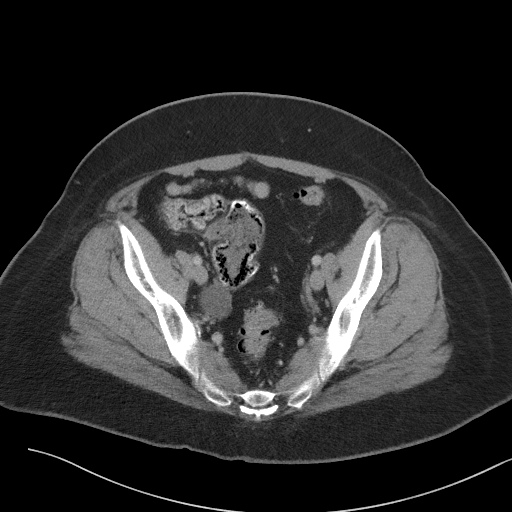
[im 36/93  soft-tissue]
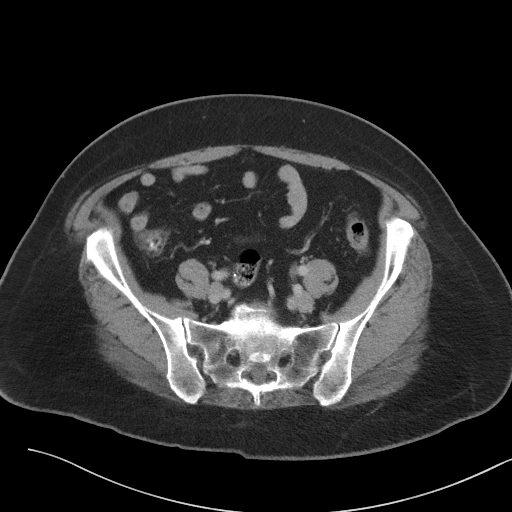
[im 43/93  soft-tissue]
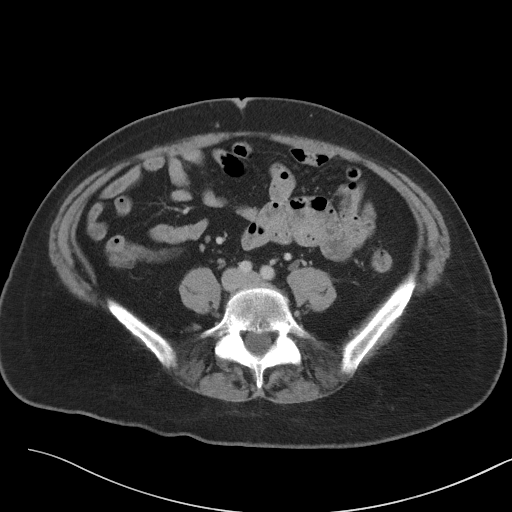
[im 50/93  soft-tissue]
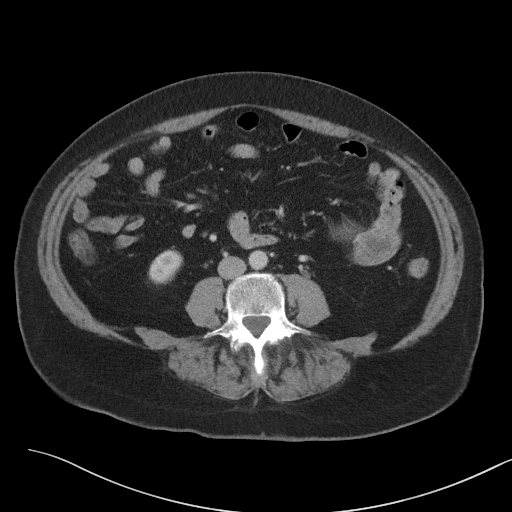
[im 57/93  soft-tissue]
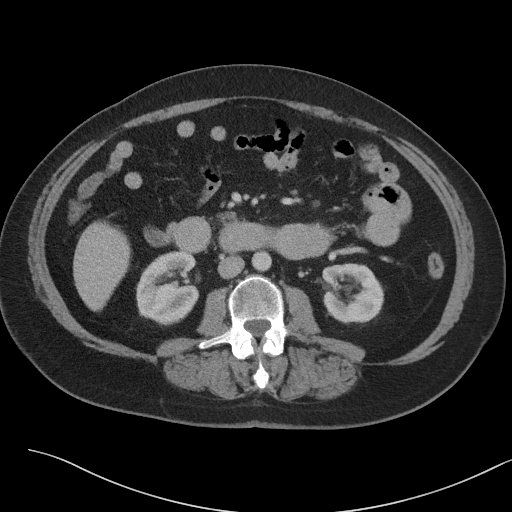
[im 64/93  soft-tissue]
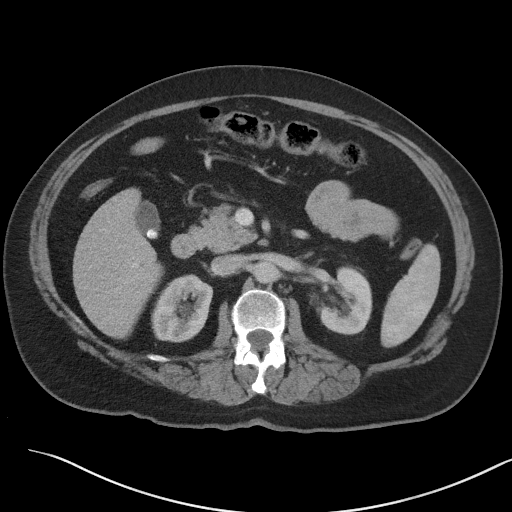
[im 64/93  bone]
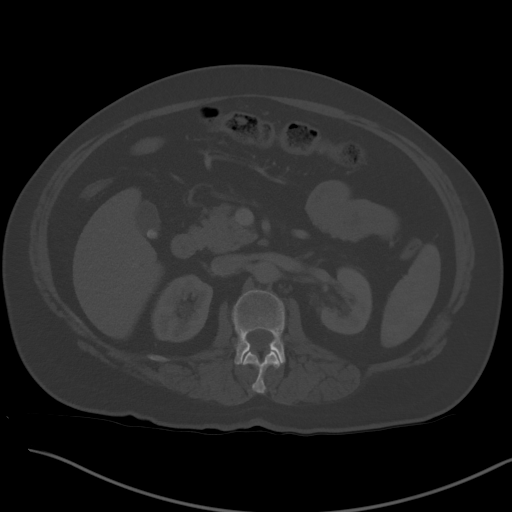
[im 71/93  soft-tissue]
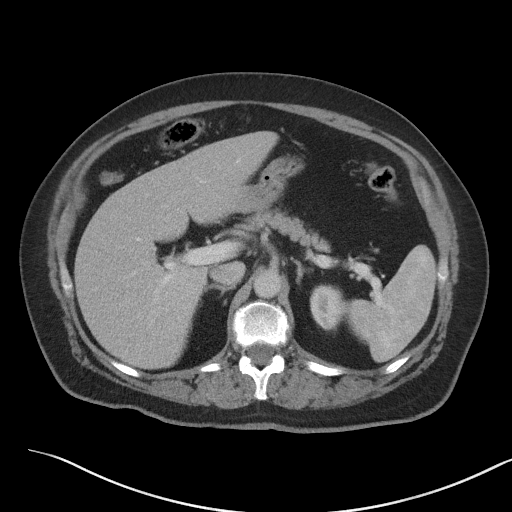
[im 78/93  soft-tissue]
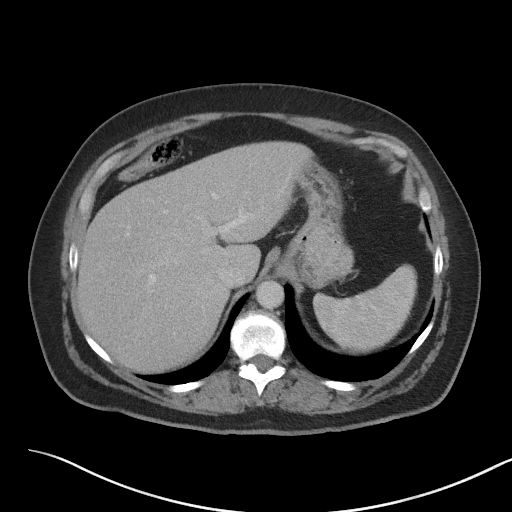
[im 85/93  soft-tissue]
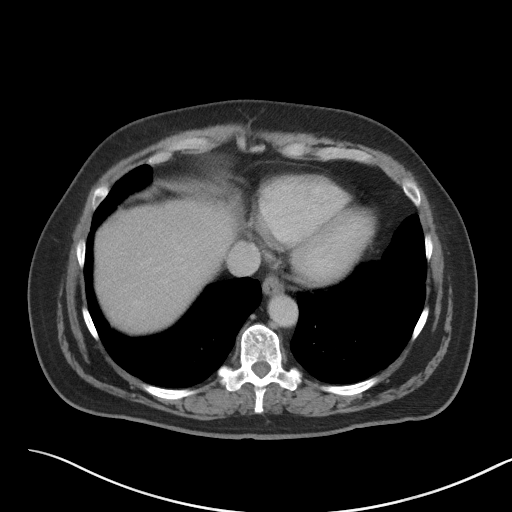

[Series 6: coronal st · coronal · 0.81mm/px · 3 of 114 slices shown]
[im 38/114  soft-tissue]
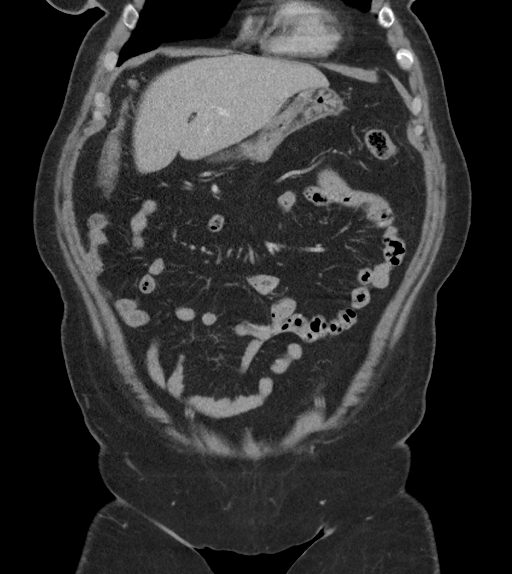
[im 51/114  soft-tissue]
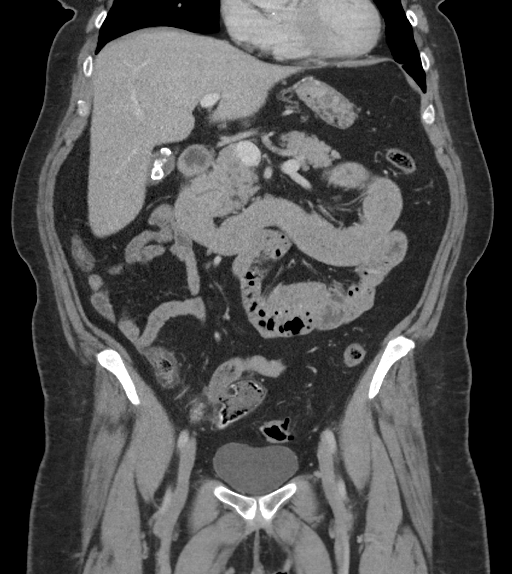
[im 63/114  soft-tissue]
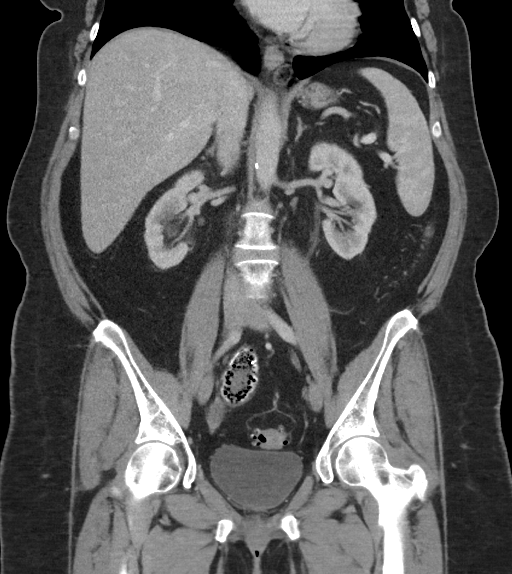

[15 of 46 positions shown; findings below may reference images not displayed]

FINDINGS: Lower chest: No acute abnormality.

Hepatobiliary: Multiple gallstones layer dependently within a
nondilated gallbladder. No evidence of wall thickening or
inflammatory changes. Liver unremarkable without focal lesion.

Pancreas: Unremarkable. No pancreatic ductal dilatation or
surrounding inflammatory changes.

Spleen: Normal in size without focal abnormality.

Adrenals/Urinary Tract: 8 mm low-density region within the superior
pole of the left kidney, too small to definitively characterize, but
most likely represents cyst. Kidneys are otherwise unremarkable. No
hydronephrosis. Ureters nondilated. Urinary bladder unremarkable.

Stomach/Bowel: Small hiatal hernia. Mild circumferential wall
thickening of the duodenum and proximal jejunum. No dilated loops of
bowel. Extensive colonic diverticulosis. Long segment bowel wall
thickening of the sigmoid colon without pericolonic inflammatory
changes. Appendix not visualized, and may be surgically absent.

Vascular/Lymphatic: No significant vascular findings are present. No
enlarged abdominal or pelvic lymph nodes.

Reproductive: Interval increase in size of right adnexal cystic
lesion measuring 3.6 x 2.9 cm (previously 2.7 x 1.9 cm) uterus is
absent.

Other: No ascites.  No abdominal wall hernia.

Musculoskeletal: No acute or significant osseous findings.
IMPRESSION: 1. Circumferential bowel wall thickening of the duodenum and
proximal jejunum suggesting a nonspecific enteritis. No bowel
obstruction.
2. Extensive sigmoid diverticulosis with long segment wall
thickening, however no pericolonic inflammatory stranding. Findings
are favored to represent sequela of chronic diverticulitis without
acute diverticulitis.
3. Cholelithiasis.
4. Slight interval increase in size of a right adnexal cystic
lesion. Further evaluation with a nonemergent pelvic ultrasound is
recommended.
5. Small hiatal hernia.

## 2021-04-21 ENCOUNTER — Ambulatory Visit: Payer: Medicare HMO | Admitting: Gastroenterology

## 2021-05-26 ENCOUNTER — Other Ambulatory Visit: Payer: Self-pay | Admitting: Emergency Medicine

## 2021-05-26 DIAGNOSIS — K5792 Diverticulitis of intestine, part unspecified, without perforation or abscess without bleeding: Secondary | ICD-10-CM

## 2021-10-27 ENCOUNTER — Encounter: Payer: Self-pay | Admitting: Internal Medicine

## 2021-11-06 ENCOUNTER — Other Ambulatory Visit: Payer: Self-pay

## 2021-11-06 ENCOUNTER — Emergency Department (HOSPITAL_COMMUNITY)
Admission: EM | Admit: 2021-11-06 | Discharge: 2021-11-06 | Disposition: A | Discharge status: Home / Self Care | Payer: Medicare HMO | Attending: Emergency Medicine | Admitting: Emergency Medicine

## 2021-11-06 ENCOUNTER — Encounter (HOSPITAL_COMMUNITY): Payer: Self-pay | Admitting: Emergency Medicine

## 2021-11-06 DIAGNOSIS — Z7951 Long term (current) use of inhaled steroids: Secondary | ICD-10-CM | POA: Insufficient documentation

## 2021-11-06 DIAGNOSIS — R509 Fever, unspecified: Secondary | ICD-10-CM | POA: Insufficient documentation

## 2021-11-06 DIAGNOSIS — Z87891 Personal history of nicotine dependence: Secondary | ICD-10-CM | POA: Insufficient documentation

## 2021-11-06 DIAGNOSIS — E119 Type 2 diabetes mellitus without complications: Secondary | ICD-10-CM | POA: Insufficient documentation

## 2021-11-06 DIAGNOSIS — J449 Chronic obstructive pulmonary disease, unspecified: Secondary | ICD-10-CM | POA: Diagnosis not present

## 2021-11-06 DIAGNOSIS — B0230 Zoster ocular disease, unspecified: Secondary | ICD-10-CM | POA: Diagnosis not present

## 2021-11-06 DIAGNOSIS — Z7984 Long term (current) use of oral hypoglycemic drugs: Secondary | ICD-10-CM | POA: Insufficient documentation

## 2021-11-06 DIAGNOSIS — R1110 Vomiting, unspecified: Secondary | ICD-10-CM | POA: Insufficient documentation

## 2021-11-06 DIAGNOSIS — Z79899 Other long term (current) drug therapy: Secondary | ICD-10-CM | POA: Insufficient documentation

## 2021-11-06 DIAGNOSIS — I10 Essential (primary) hypertension: Secondary | ICD-10-CM | POA: Insufficient documentation

## 2021-11-06 LAB — URINALYSIS, ROUTINE W REFLEX MICROSCOPIC
Bilirubin Urine: NEGATIVE
Glucose, UA: NEGATIVE mg/dL
Hgb urine dipstick: NEGATIVE
Ketones, ur: NEGATIVE mg/dL
Leukocytes,Ua: NEGATIVE
Nitrite: NEGATIVE
Protein, ur: NEGATIVE mg/dL
Specific Gravity, Urine: 1.009 (ref 1.005–1.030)
pH: 5 (ref 5.0–8.0)

## 2021-11-06 LAB — CBC WITH DIFFERENTIAL/PLATELET
Abs Immature Granulocytes: 0.02 10*3/uL (ref 0.00–0.07)
Basophils Absolute: 0 10*3/uL (ref 0.0–0.1)
Basophils Relative: 0 %
Eosinophils Absolute: 0 10*3/uL (ref 0.0–0.5)
Eosinophils Relative: 1 %
HCT: 37.5 % (ref 36.0–46.0)
Hemoglobin: 12.1 g/dL (ref 12.0–15.0)
Immature Granulocytes: 0 %
Lymphocytes Relative: 23 %
Lymphs Abs: 1.8 10*3/uL (ref 0.7–4.0)
MCH: 27.1 pg (ref 26.0–34.0)
MCHC: 32.3 g/dL (ref 30.0–36.0)
MCV: 84.1 fL (ref 80.0–100.0)
Monocytes Absolute: 0.6 10*3/uL (ref 0.1–1.0)
Monocytes Relative: 8 %
Neutro Abs: 5.2 10*3/uL (ref 1.7–7.7)
Neutrophils Relative %: 68 %
Platelets: 225 10*3/uL (ref 150–400)
RBC: 4.46 MIL/uL (ref 3.87–5.11)
RDW: 14.5 % (ref 11.5–15.5)
WBC: 7.6 10*3/uL (ref 4.0–10.5)
nRBC: 0 % (ref 0.0–0.2)

## 2021-11-06 LAB — COMPREHENSIVE METABOLIC PANEL
ALT: 13 U/L (ref 0–44)
AST: 14 U/L — ABNORMAL LOW (ref 15–41)
Albumin: 3.7 g/dL (ref 3.5–5.0)
Alkaline Phosphatase: 100 U/L (ref 38–126)
Anion gap: 8 (ref 5–15)
BUN: 23 mg/dL (ref 8–23)
CO2: 26 mmol/L (ref 22–32)
Calcium: 8.9 mg/dL (ref 8.9–10.3)
Chloride: 102 mmol/L (ref 98–111)
Creatinine, Ser: 0.83 mg/dL (ref 0.44–1.00)
GFR, Estimated: >60 mL/min (ref >60)
Glucose, Bld: 176 mg/dL — ABNORMAL HIGH (ref 70–99)
Potassium: 3.5 mmol/L (ref 3.5–5.1)
Sodium: 136 mmol/L (ref 135–145)
Total Bilirubin: 0.6 mg/dL (ref 0.3–1.2)
Total Protein: 7.1 g/dL (ref 6.5–8.1)

## 2021-11-06 LAB — LIPASE, BLOOD: Lipase: 41 U/L (ref 11–51)

## 2021-11-06 MED ORDER — TETRACAINE HCL 0.5 % OP SOLN: 1.0000 [drp] | Freq: Once | OPHTHALMIC | Status: DC

## 2021-11-06 MED ORDER — MORPHINE SULFATE (PF) 4 MG/ML IV SOLN
4.0000 mg | Freq: Once | INTRAVENOUS | Status: AC
  Administered 2021-11-06: 4 mg via INTRAVENOUS
  Filled 2021-11-06: qty 1

## 2021-11-06 MED ORDER — FLUORESCEIN SODIUM 1 MG OP STRP
1.0000 | ORAL_STRIP | Freq: Once | OPHTHALMIC | Status: DC
Start: 2021-11-06 — End: 2021-11-06

## 2021-11-06 MED ORDER — LACTATED RINGERS IV BOLUS
1000.0000 mL | Freq: Once | INTRAVENOUS | Status: AC
  Administered 2021-11-06: 1000 mL via INTRAVENOUS

## 2021-11-06 MED ORDER — ONDANSETRON HCL 4 MG/2ML IJ SOLN
4.0000 mg | Freq: Once | INTRAMUSCULAR | Status: AC
  Administered 2021-11-06: 4 mg via INTRAVENOUS
  Filled 2021-11-06: qty 2

## 2021-11-06 NOTE — ED Provider Notes (Signed)
[facility] EMERGENCY DEPARTMENT Provider Note   CSN: [id number] Arrival date & time: 11/06/21  0848     History  Chief Complaint  Patient presents with   Emesis    Jessica Underwood is a 66 y.o. female with history of hypertension, diabetes and COPD who presents to the emergency department for evaluation of vomiting after starting new antiviral medication yesterday.  Patient was diagnosed with bacterial conjunctivitis 3 days ago by her optometrist and started on antibiotic eyedrops.  However, she states that pain and redness continued to worsen, she was seen for follow-up yesterday.  Her optometrist, Dr. Larence Penning, diagnosed her with herpes zoster ophthalmicus yesterday due to a herpetic rash across her right eyelid.  She was sent home with Valtrex 1 g 3 times daily and trifluridine eyedrops.  She states that she has not started the eyedrops but has taken 3 doses of the Valtrex.  Each time she takes the Valtrex, she has severe vomiting about 15 minutes later.  She has not taken any of the medications today.  The eyedrop that she brought in was ofloxacin eyedrops, but on chart review she was prescribed Maxitrol drops for her bacterial conjunctivitis and then the antiviral drops yesterday.  She also states that she has been having intermittent subjective fevers.  No blood noted in vomit.  Per the note provided by her optometrist, she was also complaining of pain that extended into the dental region.  Patient states that she wears dentures and had to take them out yesterday due to the discomfort.  She denies abdominal pain, diarrhea, dysuria, hematuria, chest pain and shortness of breath.  Emesis Associated symptoms: fever   Associated symptoms: no abdominal pain and no diarrhea        Home Medications Prior to Admission medications   Medication Sig Start Date End Date Taking? Authorizing Provider  acetaminophen (TYLENOL) 650 MG CR tablet Take 650 mg by mouth every 8 (eight) hours as needed for  pain.    [provider]  albuterol (VENTOLIN HFA) 108 (90 Base) MCG/ACT inhaler Inhale 1-2 puffs into the lungs every 6 (six) hours as needed for wheezing or shortness of breath.    [provider]  Aspirin-Acetaminophen-Caffeine (EXCEDRIN PO) Take by mouth as needed.    [provider]  clonazePAM (KLONOPIN) 1 MG tablet Take 1 mg by mouth 2 (two) times daily as needed.     [provider]  fluticasone (FLONASE) 50 MCG/ACT nasal spray Place 2 sprays into both nostrils daily. 08/07/18   [provider]  ibuprofen (ADVIL) 200 MG tablet Take 200 mg by mouth every 6 (six) hours as needed.    [provider]  lisinopril (ZESTRIL) 5 MG tablet Take 1 tablet by mouth daily. 11/24/18   [provider]  metFORMIN (GLUCOPHAGE) 500 MG tablet Take 1 tablet by mouth daily. 12/01/18   [provider]  ondansetron (ZOFRAN) 4 MG tablet Take 4-8 mg by mouth every 8 (eight) hours as needed for nausea or vomiting.    [provider]  promethazine (PHENERGAN) 12.5 MG tablet 1/2 to 1 po q4-6 h prn nausea or vomiting 01/25/19   Fields, Darleene Cleaver, MD  rosuvastatin (CRESTOR) 20 MG tablet Take 20 mg by mouth daily.    [provider]  SYNTHROID 100 MCG tablet Take 1 tablet by mouth daily. 12/11/18   [provider]  traMADol (ULTRAM) 50 MG tablet Take 1 tablet by mouth 4 (four) times daily as needed. 10/11/18  [provider]      Allergies    Patient has no known allergies.    Review of Systems   Review of Systems  Constitutional:  Positive for fever.  Eyes:  Positive for pain.  Respiratory:  Negative for shortness of breath.   Gastrointestinal:  Positive for vomiting. Negative for abdominal pain and diarrhea.  Skin:  Positive for rash.    Physical Exam Updated Vital Signs BP (!) 152/70   Pulse 74   Temp 98.5 F (36.9 C) (Oral)   Resp 20   Ht 5\' 3"  (1.6 m)   Wt 85.7 kg   SpO2 95%   BMI 33.48 kg/m   Physical Exam Vitals and nursing note reviewed.  Constitutional:      General: She is not in acute distress.    Appearance: She is not ill-appearing.  HENT:     Head: Atraumatic.     Right Ear: Tympanic membrane normal.     Left Ear: Tympanic membrane normal.     Mouth/Throat:     Comments: Dentures were removed, no signs of infection, redness, erythema, induration or swelling.  The entire gumline was palpated without pain. Eyes:     Conjunctiva/sclera: Conjunctivae normal.     Visual Fields: Right eye visual fields normal and left eye visual fields normal.     Comments: Swelling and vesicular rash noted across right forehead in dermatomal pattern.  Cardiovascular:     Rate and Rhythm: Normal rate and regular rhythm.     Pulses: Normal pulses.     Heart sounds: No murmur heard. Pulmonary:     Effort: Pulmonary effort is normal. No respiratory distress.     Breath sounds: Normal breath sounds.  Abdominal:     General: Abdomen is flat. There is no distension.     Palpations: Abdomen is soft.     Tenderness: There is no abdominal tenderness.     Comments: Abdomen is soft, nondistended and nontender to palpation.  Musculoskeletal:        General: Normal range of motion.     Cervical back: Normal range of motion.  Skin:    General: Skin is warm and dry.     Capillary Refill: Capillary refill takes less than 2 seconds.  Neurological:     General: No focal deficit present.     Mental Status: She is alert.  Psychiatric:        Mood and Affect: Mood normal.     ED Results / Procedures / Treatments   Labs (all labs ordered are listed, but only abnormal results are displayed) Labs Reviewed  URINALYSIS, ROUTINE W REFLEX MICROSCOPIC - Abnormal; Notable for the following components:      Result Value   Color, Urine STRAW (*)    All other components within normal limits  COMPREHENSIVE METABOLIC PANEL - Abnormal; Notable for the following components:   Glucose, Bld 176 (*)    AST  14 (*)    All other components within normal limits  CBC WITH DIFFERENTIAL/PLATELET  LIPASE, BLOOD    EKG None  Radiology No results found.  Procedures Procedures    Medications Ordered in ED Medications  ondansetron (ZOFRAN) injection 4 mg (4 mg Intravenous Given 11/06/21 0942)  lactated ringers bolus 1,000 mL (0 mLs Intravenous Stopped 11/06/21 1051)  morphine (PF) 4 MG/ML injection 4 mg (4 mg Intravenous Given 11/06/21 1008)    ED Course/ Medical Decision Making/ A&P  Medical Decision Making Amount and/or Complexity of Data Reviewed Labs: ordered.  Risk Prescription drug management.  Social determinants of health:  Social History   Socioeconomic History   Marital status: Married    Spouse name: Not on file   Number of children: Not on file   Years of education: Not on file   Highest education level: Not on file  Occupational History   Not on file  Tobacco Use   Smoking status: Former    Packs/day: 0.50    Years: 30.00    Total pack years: 15.00    Types: Cigarettes    Quit date: 09/2019    Years since quitting: 2.1   Smokeless tobacco: Never  Vaping Use   Vaping Use: Never used  Substance and Sexual Activity   Alcohol use: No   Drug use: No   Sexual activity: Yes    Birth control/protection: Surgical  Other Topics Concern   Not on file  Social History Narrative   MARRIED [id number]). 2 EGBT(5176, 1982). 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   Differentials include medication side effect, sepsis, pancreatitis, gastroenteritis.   Comorbidities affecting care:  COPD, HTN, DM  Additional history obtained: Note sent from optometrist  Lab Tests  I Ordered, reviewed, and interpreted labs and EKG.  The pertinent results include:  CMP, CBC and UA normal, negative lipase  Medicines ordered and prescription drug management:  I ordered medication including: LR bolus 1L Zofran 4mg  IV   Morphine 4mg  Reevaluation of the patient after these medicines showed that the patient improved I have reviewed the patients home medicines and have made adjustments as needed   Consultations Obtained:  I requested consultation with ophthalmology and spoke with Dr. ,  and discussed lab and imaging findings as well as pertinent plan - they recommend: He would like patient to come straight over to his office this morning so that he can evaluate her   ED Course/Re-evaluation: Presents in no acute distress and is nontoxic-appearing.  Vitals are without significant abnormality.  On exam, she has a notable vesicular rash across her right eye and right forehead in a dermatomal pattern.  Tympanic membranes clear.  Abdomen is soft, nondistended and nontender to palpation.  Labs were obtained to rule out sepsis or bacterial etiology of symptoms.  CMP, CBC, lipase and urine were all normal.  Overall, symptoms not consistent with bacterial etiology or sepsis.  Discussed case with my attending, Dr. who evaluated the patient and agrees with plan for labs screening with fluids and Zofran.  Patient was also given morphine 4 mg due to severe pain with improvement in symptoms.  I consulted with ophthalmology and spoke to Dr. Allena Katz who request patient be sent over straightaway so that he can personally  evaluate.  We will leave medication changes to him to expedite discharge so that patient  can drive over to ophthalmology appointment.  Return precautions discussed.  Disposition:  After consideration of the diagnostic results, physical exam, history and the patients response to treatment feel that the patent would benefit from discharge.   Herpes zoster ophthalmicus of right eye:  Vomiting: plan and management as described above. Discharged home in good condition.  Final Clinical Impression(s) / ED Diagnoses Final diagnoses:  Herpes zoster ophthalmicus of right eye  Vomiting, unspecified vomiting type, unspecified whether nausea present    Rx / DC Orders ED Discharge Orders     None         Delight Ovens 11/06/21 1128    Eber Hong, MD 11/09/21 1215

## 2021-11-06 NOTE — ED Triage Notes (Signed)
Pt diagnosed with shingles in the right eye. Pt started on valacyclovir PO TID and ofloxacin yesterday. Pt started vomiting around 8pm last night. Pt reports 3-4 episodes with large amounts of vomiting. Last reported meal was [employer] yesterday for dinner.

## 2021-11-06 NOTE — Discharge Instructions (Addendum)
Please go straight to Dr. Eliane Decree office so that he can take a look at your eye and make any medication changes

## 2021-12-10 ENCOUNTER — Ambulatory Visit: Payer: Medicare HMO | Admitting: Internal Medicine

## 2022-02-12 ENCOUNTER — Other Ambulatory Visit: Payer: Self-pay | Admitting: Orthopedic Surgery

## 2022-02-12 DIAGNOSIS — M5412 Radiculopathy, cervical region: Secondary | ICD-10-CM

## 2022-03-23 ENCOUNTER — Encounter: Payer: Self-pay | Admitting: *Deleted

## 2022-09-13 ENCOUNTER — Encounter: Payer: Self-pay | Admitting: *Deleted

## 2022-11-29 DIAGNOSIS — I10 Essential (primary) hypertension: Secondary | ICD-10-CM | POA: Diagnosis not present

## 2022-11-29 DIAGNOSIS — F4310 Post-traumatic stress disorder, unspecified: Secondary | ICD-10-CM | POA: Diagnosis not present

## 2022-11-29 DIAGNOSIS — E7800 Pure hypercholesterolemia, unspecified: Secondary | ICD-10-CM | POA: Diagnosis not present

## 2022-11-29 DIAGNOSIS — Z0000 Encounter for general adult medical examination without abnormal findings: Secondary | ICD-10-CM | POA: Diagnosis not present

## 2022-11-29 DIAGNOSIS — E039 Hypothyroidism, unspecified: Secondary | ICD-10-CM | POA: Diagnosis not present

## 2022-11-29 DIAGNOSIS — F419 Anxiety disorder, unspecified: Secondary | ICD-10-CM | POA: Diagnosis not present

## 2022-11-29 DIAGNOSIS — J3089 Other allergic rhinitis: Secondary | ICD-10-CM | POA: Diagnosis not present

## 2022-11-29 DIAGNOSIS — E119 Type 2 diabetes mellitus without complications: Secondary | ICD-10-CM | POA: Diagnosis not present

## 2022-11-29 DIAGNOSIS — M069 Rheumatoid arthritis, unspecified: Secondary | ICD-10-CM | POA: Diagnosis not present

## 2022-12-09 DIAGNOSIS — R300 Dysuria: Secondary | ICD-10-CM | POA: Diagnosis not present

## 2022-12-09 DIAGNOSIS — R030 Elevated blood-pressure reading, without diagnosis of hypertension: Secondary | ICD-10-CM | POA: Diagnosis not present

## 2022-12-09 DIAGNOSIS — R42 Dizziness and giddiness: Secondary | ICD-10-CM | POA: Diagnosis not present

## 2022-12-09 DIAGNOSIS — H6503 Acute serous otitis media, bilateral: Secondary | ICD-10-CM | POA: Diagnosis not present

## 2022-12-22 DIAGNOSIS — M069 Rheumatoid arthritis, unspecified: Secondary | ICD-10-CM | POA: Diagnosis not present

## 2022-12-22 DIAGNOSIS — M0579 Rheumatoid arthritis with rheumatoid factor of multiple sites without organ or systems involvement: Secondary | ICD-10-CM | POA: Diagnosis not present

## 2022-12-22 DIAGNOSIS — Z7960 Long term (current) use of unspecified immunomodulators and immunosuppressants: Secondary | ICD-10-CM | POA: Diagnosis not present

## 2022-12-29 DIAGNOSIS — F4310 Post-traumatic stress disorder, unspecified: Secondary | ICD-10-CM | POA: Diagnosis not present

## 2022-12-29 DIAGNOSIS — F419 Anxiety disorder, unspecified: Secondary | ICD-10-CM | POA: Diagnosis not present

## 2023-03-02 DIAGNOSIS — F419 Anxiety disorder, unspecified: Secondary | ICD-10-CM | POA: Diagnosis not present

## 2023-03-02 DIAGNOSIS — F4310 Post-traumatic stress disorder, unspecified: Secondary | ICD-10-CM | POA: Diagnosis not present

## 2023-03-02 DIAGNOSIS — Z79899 Other long term (current) drug therapy: Secondary | ICD-10-CM | POA: Diagnosis not present

## 2023-03-29 DIAGNOSIS — F419 Anxiety disorder, unspecified: Secondary | ICD-10-CM | POA: Diagnosis not present

## 2023-03-29 DIAGNOSIS — R5381 Other malaise: Secondary | ICD-10-CM | POA: Diagnosis not present

## 2023-03-29 DIAGNOSIS — M0579 Rheumatoid arthritis with rheumatoid factor of multiple sites without organ or systems involvement: Secondary | ICD-10-CM | POA: Diagnosis not present

## 2023-03-29 DIAGNOSIS — I10 Essential (primary) hypertension: Secondary | ICD-10-CM | POA: Diagnosis not present

## 2023-03-29 DIAGNOSIS — E538 Deficiency of other specified B group vitamins: Secondary | ICD-10-CM | POA: Diagnosis not present

## 2023-03-29 DIAGNOSIS — Z7960 Long term (current) use of unspecified immunomodulators and immunosuppressants: Secondary | ICD-10-CM | POA: Diagnosis not present

## 2023-03-29 DIAGNOSIS — E039 Hypothyroidism, unspecified: Secondary | ICD-10-CM | POA: Diagnosis not present

## 2023-03-29 DIAGNOSIS — D649 Anemia, unspecified: Secondary | ICD-10-CM | POA: Diagnosis not present

## 2023-03-29 DIAGNOSIS — M069 Rheumatoid arthritis, unspecified: Secondary | ICD-10-CM | POA: Diagnosis not present

## 2023-03-29 DIAGNOSIS — J449 Chronic obstructive pulmonary disease, unspecified: Secondary | ICD-10-CM | POA: Diagnosis not present

## 2023-03-29 DIAGNOSIS — Z87891 Personal history of nicotine dependence: Secondary | ICD-10-CM | POA: Diagnosis not present

## 2023-03-29 DIAGNOSIS — E119 Type 2 diabetes mellitus without complications: Secondary | ICD-10-CM | POA: Diagnosis not present

## 2023-03-29 DIAGNOSIS — Z6835 Body mass index (BMI) 35.0-35.9, adult: Secondary | ICD-10-CM | POA: Diagnosis not present

## 2023-04-07 DIAGNOSIS — R059 Cough, unspecified: Secondary | ICD-10-CM | POA: Diagnosis not present

## 2023-04-07 DIAGNOSIS — J441 Chronic obstructive pulmonary disease with (acute) exacerbation: Secondary | ICD-10-CM | POA: Diagnosis not present

## 2023-05-18 DIAGNOSIS — Z7960 Long term (current) use of unspecified immunomodulators and immunosuppressants: Secondary | ICD-10-CM | POA: Diagnosis not present

## 2023-05-18 DIAGNOSIS — M0579 Rheumatoid arthritis with rheumatoid factor of multiple sites without organ or systems involvement: Secondary | ICD-10-CM | POA: Diagnosis not present

## 2023-05-18 DIAGNOSIS — D649 Anemia, unspecified: Secondary | ICD-10-CM | POA: Diagnosis not present

## 2023-05-24 ENCOUNTER — Other Ambulatory Visit: Payer: Self-pay | Admitting: *Deleted

## 2023-05-24 ENCOUNTER — Encounter: Payer: Self-pay | Admitting: *Deleted

## 2023-05-26 ENCOUNTER — Telehealth: Payer: Self-pay | Admitting: Internal Medicine

## 2023-05-26 ENCOUNTER — Encounter: Payer: Self-pay | Admitting: Internal Medicine

## 2023-05-26 ENCOUNTER — Telehealth: Payer: Self-pay

## 2023-05-26 ENCOUNTER — Other Ambulatory Visit: Payer: Self-pay

## 2023-05-26 ENCOUNTER — Inpatient Hospital Stay: Payer: Medicare HMO | Attending: Internal Medicine | Admitting: Internal Medicine

## 2023-05-26 ENCOUNTER — Inpatient Hospital Stay: Payer: Medicare HMO

## 2023-05-26 VITALS — BP 118/60 | HR 90 | Temp 99.6°F | Resp 18 | Ht 63.0 in | Wt 207.0 lb

## 2023-05-26 DIAGNOSIS — Z803 Family history of malignant neoplasm of breast: Secondary | ICD-10-CM | POA: Insufficient documentation

## 2023-05-26 DIAGNOSIS — J449 Chronic obstructive pulmonary disease, unspecified: Secondary | ICD-10-CM | POA: Insufficient documentation

## 2023-05-26 DIAGNOSIS — Z87891 Personal history of nicotine dependence: Secondary | ICD-10-CM | POA: Insufficient documentation

## 2023-05-26 DIAGNOSIS — D509 Iron deficiency anemia, unspecified: Secondary | ICD-10-CM | POA: Insufficient documentation

## 2023-05-26 DIAGNOSIS — E538 Deficiency of other specified B group vitamins: Secondary | ICD-10-CM | POA: Insufficient documentation

## 2023-05-26 DIAGNOSIS — I10 Essential (primary) hypertension: Secondary | ICD-10-CM | POA: Diagnosis not present

## 2023-05-26 DIAGNOSIS — Z800 Family history of malignant neoplasm of digestive organs: Secondary | ICD-10-CM | POA: Insufficient documentation

## 2023-05-26 DIAGNOSIS — E1159 Type 2 diabetes mellitus with other circulatory complications: Secondary | ICD-10-CM | POA: Insufficient documentation

## 2023-05-26 DIAGNOSIS — Z808 Family history of malignant neoplasm of other organs or systems: Secondary | ICD-10-CM | POA: Insufficient documentation

## 2023-05-26 DIAGNOSIS — Z79899 Other long term (current) drug therapy: Secondary | ICD-10-CM | POA: Insufficient documentation

## 2023-05-26 NOTE — Telephone Encounter (Signed)
Msg sent to Sue Lush- per patient she was initially informed by [facility] that her out of pocket expense be $400+ for each infusion.

## 2023-05-26 NOTE — Progress Notes (Signed)
[facility]  [phone] [phone]  ID: SHANTEA POULTON OB: [birth year]  MR#: [id number]  WUX#:[id number]  Patient Care Team: Smith Robert, MD as PCP - General (Family Medicine) Jim Like, RN as Registered Nurse Scarlett Presto, RN (Inactive) as Registered Nurse West Bali, MD (Inactive) as Consulting Physician (Gastroenterology) Lanelle Bal, DO as Consulting Physician (Gastroenterology) Michaelyn Barter, MD as Consulting Physician (Oncology)  REFERRING PROVIDER: Dr. Allena Katz  REASON FOR REFERRAL: Iron deficiency anemia  HPI: Jessica Underwood is a 68 y.o. female with past medical history of COPD, diverticulosis, diabetes, hypertension, referred to hematology for iron deficiency anemia.  Hemoglobin was normal up until July 2024 when it trended down to 9.7.  Most recent hemoglobin is 8.8 with ferritin of 3. Patient denies any bleeding in urine or stools. Last colonoscopy was in 2016. Did not tolerate iron pill-caused nausea and vomiting. Feeling increasingly fatigued.  REVIEW OF SYSTEMS:   ROS  As per HPI. Otherwise, a complete review of systems is negative.  PAST MEDICAL HISTORY: Past Medical History:  Diagnosis Date   Arthritis    fingers, back, left shoulder, knees   Colon polyps 2016   COPD (chronic obstructive pulmonary disease) (HCC)    Diabetes mellitus    Diverticulitis large intestine 12/2018   UNCOMPLICATED   Diverticulosis    Dyspnea    with exertion   Full dentures    Hearing aid worn    bilateral   Hypertension    Motion sickness    PONV (postoperative nausea and vomiting)     PAST SURGICAL HISTORY: Past Surgical History:  Procedure Laterality Date   ABDOMINAL HYSTERECTOMY     APPENDECTOMY     CATARACT EXTRACTION W/PHACO Right 07/25/2020   Procedure: CATARACT EXTRACTION PHACO AND INTRAOCULAR LENS PLACEMENT (IOC) RIGHT DIABETIC 11.20 01:16.2 14.7%;  Surgeon: Lockie Mola, MD;  Location:  [facility];  Service: Ophthalmology;  Laterality: Right;  COVID + 05-05-20 [facility] HAVE COPY IN CHART   CATARACT EXTRACTION W/PHACO Left 08/06/2020   Procedure: CATARACT EXTRACTION PHACO AND INTRAOCULAR LENS PLACEMENT (IOC) LEFT DIABETIC 11.06 01:36.9 11.4%;  Surgeon: Lockie Mola, MD;  Location: [facility];  Service: Ophthalmology;  Laterality: Left;  Diabetic - oral meds   COLONOSCOPY W/ POLYPECTOMY  2016   COLON POLYPS   TONSILLECTOMY      FAMILY HISTORY: Family History  Problem Relation Age of Onset   Stomach cancer Mother        AGE > 60   Colon cancer Father        AGE > 60   Skin cancer Father        nose   Breast cancer Maternal Aunt    Stomach cancer Maternal Aunt    Other Maternal Uncle        mouth   Breast cancer Cousin        "Female" breast cancer   Liver cancer Cousin    Colon polyps Neg Hx     HEALTH MAINTENANCE: Social History   Tobacco Use   Smoking status: Former    Current packs/day: 0.00    Average packs/day: 0.5 packs/day for 30.0 years (15.0 ttl pk-yrs)    Types: Cigarettes    Start date: 09/1989    Quit date: 09/2019    Years since quitting: 3.7   Smokeless tobacco: Never  Vaping Use   Vaping status: Never Used  Substance Use Topics   Alcohol use: No  Drug use: No     Allergies  Allergen Reactions   Nitrofurantoin Other (See Comments)   Penicillin G Itching    Current Outpatient Medications  Medication Sig Dispense Refill   acetaminophen (TYLENOL) 650 MG CR tablet Take 650 mg by mouth every 8 (eight) hours as needed for pain.     clonazePAM (KLONOPIN) 1 MG tablet Take 1 mg by mouth 2 (two) times daily as needed.      etanercept (ENBREL) 50 MG/ML injection Inject 50 mg into the skin once a week.     hydroxychloroquine (PLAQUENIL) 200 MG tablet Take 200 mg by mouth 2 (two) times daily.     lisinopril (ZESTRIL) 5 MG tablet Take 1 tablet by mouth daily.     metFORMIN (GLUCOPHAGE) 500 MG tablet  Take 1 tablet by mouth daily.     predniSONE (DELTASONE) 5 MG tablet Take 5 mg by mouth daily with breakfast.     rosuvastatin (CRESTOR) 20 MG tablet Take 20 mg by mouth daily.     SYNTHROID 100 MCG tablet Take 1 tablet by mouth daily.     albuterol (VENTOLIN HFA) 108 (90 Base) MCG/ACT inhaler Inhale 1-2 puffs into the lungs every 6 (six) hours as needed for wheezing or shortness of breath. (Patient not taking: Reported on 05/26/2023)     Aspirin-Acetaminophen-Caffeine (EXCEDRIN PO) Take by mouth as needed. (Patient not taking: Reported on 05/26/2023)     fluticasone (FLONASE) 50 MCG/ACT nasal spray Place 2 sprays into both nostrils daily. (Patient not taking: Reported on 05/26/2023)     ibuprofen (ADVIL) 200 MG tablet Take 200 mg by mouth every 6 (six) hours as needed. (Patient not taking: Reported on 05/26/2023)     ondansetron (ZOFRAN) 4 MG tablet Take 4-8 mg by mouth every 8 (eight) hours as needed for nausea or vomiting. (Patient not taking: Reported on 05/26/2023)     promethazine (PHENERGAN) 12.5 MG tablet 1/2 to 1 po q4-6 h prn nausea or vomiting (Patient not taking: Reported on 05/26/2023) 30 tablet 3   No current facility-administered medications for this visit.    OBJECTIVE: Vitals:   05/26/23 1400  BP: 118/60  Pulse: 90  Resp: 18  Temp: 99.6 F (37.6 C)     Body mass index is 36.67 kg/m.      General: Well-developed, well-nourished, no acute distress. Eyes: Pink conjunctiva, anicteric sclera. HEENT: Normocephalic, moist mucous membranes, clear oropharnyx. Lungs: Clear to auscultation bilaterally. Heart: Regular rate and rhythm. No rubs, murmurs, or gallops. Abdomen: Soft, nontender, nondistended. No organomegaly noted, normoactive bowel sounds. Musculoskeletal: No edema, cyanosis, or clubbing. Neuro: Alert, answering all questions appropriately. Cranial nerves grossly intact. Skin: No rashes or petechiae noted. Psych: Normal affect. Lymphatics: No cervical, calvicular,  axillary or inguinal LAD.   LAB RESULTS:  Lab Results  Component Value Date   NA 136 11/06/2021   K 3.5 11/06/2021   CL 102 11/06/2021   CO2 26 11/06/2021   GLUCOSE 176 (H) 11/06/2021   BUN 23 11/06/2021   CREATININE 0.83 11/06/2021   CALCIUM 8.9 11/06/2021   PROT 7.1 11/06/2021   ALBUMIN 3.7 11/06/2021   AST 14 (L) 11/06/2021   ALT 13 11/06/2021   ALKPHOS 100 11/06/2021   BILITOT 0.6 11/06/2021   GFRNONAA >60 11/06/2021   GFRAA >60 12/20/2018    Lab Results  Component Value Date   WBC 7.6 11/06/2021   NEUTROABS 5.2 11/06/2021   HGB 12.1 11/06/2021   HCT 37.5 11/06/2021   MCV 84.1  11/06/2021   PLT 225 11/06/2021    No results found for: "TIBC", "FERRITIN", "IRONPCTSAT"   STUDIES: No results found.  ASSESSMENT AND PLAN:   Jessica Underwood is a 68 y.o. female with pmh of COPD, diverticulosis, diabetes, hypertension, referred to hematology for iron deficiency anemia.  # Iron deficiency anemia -New.  Present since July 2024.  Unclear etiology.  Cannot tolerate oral iron due to nausea and vomiting. -Recent labs show hemoglobin 8.8 ferritin 3. -Discussed about IV Venofer 200 mg weekly x 5 doses.  Occasional side effects such as nausea, chest pain, back pain, allergic reaction was discussed.  Patient is concerned about the insurance coverage and the high copayments.  Will have the insurance staff to provide her with information on copayments.  She would like to hold off on scheduling until she has that information. -She is scheduled to see Dr. Norma Fredrickson in April 2025.  Requested for a sooner appointment for consideration of colonoscopy and endoscopy with new iron deficiency anemia  # Vitamin B12 deficiency -B12 209.  Advised to start on B12 supplements 1000 mcg once daily over-the-counter.   Orders Placed This Encounter  Procedures   CBC with Differential/Platelet   Iron and TIBC   Ferritin   Vitamin B12    RTC in 3 months for MD visit, labs, Venofer  Patient  expressed understanding and was in agreement with this plan. She also understands that She can call clinic at any time with any questions, concerns, or complaints.   I spent a total of 45 minutes reviewing chart data, face-to-face evaluation with the patient, counseling and coordination of care as detailed above.  Michaelyn Barter, MD   05/26/2023 3:27 PM

## 2023-05-26 NOTE — Telephone Encounter (Signed)
Patient asked to wait to schedule IV venofer weekly x 5 until she hears from the insurance company how much it will cost. Asked her to call when she is ready to schedule.

## 2023-05-26 NOTE — Patient Instructions (Signed)
Please start vitamin b12 supplements 1000 mcg once daily. Available over the counter.

## 2023-05-26 NOTE — Telephone Encounter (Signed)
Called Dr. Horace Porteous office to see if we could get this patient in any sooner than April, and my RN, Herbert Seta spoke with the receptionist and she told Herbert Seta, RN that she would talk to their PA Jacob Moores, to see if they could get her in sooner than April 2nd, which is when her original appointment is scheduled for.

## 2023-05-27 NOTE — Telephone Encounter (Signed)
Of note- no copay assistance is available for venofer

## 2023-06-03 ENCOUNTER — Encounter: Payer: Self-pay | Admitting: *Deleted

## 2023-06-03 DIAGNOSIS — R198 Other specified symptoms and signs involving the digestive system and abdomen: Secondary | ICD-10-CM | POA: Diagnosis not present

## 2023-06-03 DIAGNOSIS — Z860101 Personal history of adenomatous and serrated colon polyps: Secondary | ICD-10-CM | POA: Diagnosis not present

## 2023-06-03 DIAGNOSIS — R110 Nausea: Secondary | ICD-10-CM | POA: Diagnosis not present

## 2023-06-03 DIAGNOSIS — Z8719 Personal history of other diseases of the digestive system: Secondary | ICD-10-CM | POA: Diagnosis not present

## 2023-06-03 DIAGNOSIS — Z800 Family history of malignant neoplasm of digestive organs: Secondary | ICD-10-CM | POA: Diagnosis not present

## 2023-06-03 DIAGNOSIS — D500 Iron deficiency anemia secondary to blood loss (chronic): Secondary | ICD-10-CM | POA: Diagnosis not present

## 2023-06-06 ENCOUNTER — Encounter: Payer: Self-pay | Admitting: Gastroenterology

## 2023-06-06 ENCOUNTER — Other Ambulatory Visit: Payer: Self-pay | Admitting: Gastroenterology

## 2023-06-06 ENCOUNTER — Ambulatory Visit: Payer: Medicare HMO | Admitting: Certified Registered"

## 2023-06-06 ENCOUNTER — Encounter
Admission: RE | Disposition: A | Discharge status: Home / Self Care | Payer: Self-pay | Source: Ambulatory Visit | Attending: Gastroenterology

## 2023-06-06 ENCOUNTER — Ambulatory Visit
Admission: RE | Admit: 2023-06-06 | Discharge: 2023-06-06 | Disposition: A | Discharge status: Home / Self Care | Payer: Medicare HMO | Source: Ambulatory Visit | Attending: Gastroenterology | Admitting: Gastroenterology

## 2023-06-06 DIAGNOSIS — E669 Obesity, unspecified: Secondary | ICD-10-CM | POA: Insufficient documentation

## 2023-06-06 DIAGNOSIS — Z860101 Personal history of adenomatous and serrated colon polyps: Secondary | ICD-10-CM | POA: Diagnosis not present

## 2023-06-06 DIAGNOSIS — J449 Chronic obstructive pulmonary disease, unspecified: Secondary | ICD-10-CM | POA: Diagnosis not present

## 2023-06-06 DIAGNOSIS — I10 Essential (primary) hypertension: Secondary | ICD-10-CM | POA: Insufficient documentation

## 2023-06-06 DIAGNOSIS — Z7984 Long term (current) use of oral hypoglycemic drugs: Secondary | ICD-10-CM | POA: Diagnosis not present

## 2023-06-06 DIAGNOSIS — D124 Benign neoplasm of descending colon: Secondary | ICD-10-CM | POA: Insufficient documentation

## 2023-06-06 DIAGNOSIS — K2970 Gastritis, unspecified, without bleeding: Secondary | ICD-10-CM | POA: Diagnosis not present

## 2023-06-06 DIAGNOSIS — Z6836 Body mass index (BMI) 36.0-36.9, adult: Secondary | ICD-10-CM | POA: Diagnosis not present

## 2023-06-06 DIAGNOSIS — K2950 Unspecified chronic gastritis without bleeding: Secondary | ICD-10-CM | POA: Diagnosis not present

## 2023-06-06 DIAGNOSIS — K635 Polyp of colon: Secondary | ICD-10-CM | POA: Diagnosis not present

## 2023-06-06 DIAGNOSIS — D122 Benign neoplasm of ascending colon: Secondary | ICD-10-CM | POA: Diagnosis not present

## 2023-06-06 DIAGNOSIS — K5730 Diverticulosis of large intestine without perforation or abscess without bleeding: Secondary | ICD-10-CM | POA: Diagnosis not present

## 2023-06-06 DIAGNOSIS — Z79899 Other long term (current) drug therapy: Secondary | ICD-10-CM | POA: Insufficient documentation

## 2023-06-06 DIAGNOSIS — K649 Unspecified hemorrhoids: Secondary | ICD-10-CM | POA: Diagnosis not present

## 2023-06-06 DIAGNOSIS — Z90710 Acquired absence of both cervix and uterus: Secondary | ICD-10-CM | POA: Insufficient documentation

## 2023-06-06 DIAGNOSIS — E119 Type 2 diabetes mellitus without complications: Secondary | ICD-10-CM | POA: Insufficient documentation

## 2023-06-06 DIAGNOSIS — M069 Rheumatoid arthritis, unspecified: Secondary | ICD-10-CM | POA: Insufficient documentation

## 2023-06-06 DIAGNOSIS — Z800 Family history of malignant neoplasm of digestive organs: Secondary | ICD-10-CM | POA: Insufficient documentation

## 2023-06-06 DIAGNOSIS — Z9049 Acquired absence of other specified parts of digestive tract: Secondary | ICD-10-CM | POA: Insufficient documentation

## 2023-06-06 DIAGNOSIS — D125 Benign neoplasm of sigmoid colon: Secondary | ICD-10-CM | POA: Diagnosis not present

## 2023-06-06 DIAGNOSIS — D509 Iron deficiency anemia, unspecified: Secondary | ICD-10-CM | POA: Insufficient documentation

## 2023-06-06 DIAGNOSIS — B9681 Helicobacter pylori [H. pylori] as the cause of diseases classified elsewhere: Secondary | ICD-10-CM | POA: Diagnosis not present

## 2023-06-06 DIAGNOSIS — D123 Benign neoplasm of transverse colon: Secondary | ICD-10-CM | POA: Insufficient documentation

## 2023-06-06 DIAGNOSIS — Z1211 Encounter for screening for malignant neoplasm of colon: Secondary | ICD-10-CM | POA: Diagnosis not present

## 2023-06-06 DIAGNOSIS — Z87891 Personal history of nicotine dependence: Secondary | ICD-10-CM | POA: Diagnosis not present

## 2023-06-06 HISTORY — PX: COLONOSCOPY WITH PROPOFOL: SHX5780

## 2023-06-06 HISTORY — PX: ESOPHAGOGASTRODUODENOSCOPY (EGD) WITH PROPOFOL: SHX5813

## 2023-06-06 HISTORY — PX: POLYPECTOMY: SHX5525

## 2023-06-06 HISTORY — PX: BIOPSY: SHX5522

## 2023-06-06 LAB — GLUCOSE, CAPILLARY: Glucose-Capillary: 89 mg/dL (ref 70–99)

## 2023-06-06 SURGERY — COLONOSCOPY WITH PROPOFOL
Anesthesia: General

## 2023-06-06 MED ORDER — MIDAZOLAM HCL 2 MG/2ML IJ SOLN
INTRAMUSCULAR | Status: AC
  Filled 2023-06-06: qty 2

## 2023-06-06 MED ORDER — PROPOFOL 10 MG/ML IV BOLUS
INTRAVENOUS | Status: DC | PRN
  Administered 2023-06-06: 60 mg via INTRAVENOUS
  Administered 2023-06-06: 20 mg via INTRAVENOUS

## 2023-06-06 MED ORDER — PROPOFOL 500 MG/50ML IV EMUL
INTRAVENOUS | Status: DC | PRN
  Administered 2023-06-06: 145 ug/kg/min via INTRAVENOUS

## 2023-06-06 MED ORDER — MIDAZOLAM HCL 2 MG/2ML IJ SOLN
INTRAMUSCULAR | Status: DC | PRN
  Administered 2023-06-06: 2 mg via INTRAVENOUS

## 2023-06-06 MED ORDER — GLYCOPYRROLATE 0.2 MG/ML IJ SOLN
INTRAMUSCULAR | Status: DC | PRN
  Administered 2023-06-06: .2 mg via INTRAVENOUS

## 2023-06-06 MED ORDER — LIDOCAINE HCL (CARDIAC) PF 100 MG/5ML IV SOSY
PREFILLED_SYRINGE | INTRAVENOUS | Status: DC | PRN
  Administered 2023-06-06: 100 mg via INTRAVENOUS

## 2023-06-06 MED ORDER — SODIUM CHLORIDE 0.9 % IV SOLN
INTRAVENOUS | Status: DC
  Administered 2023-06-06: 20 mL/h via INTRAVENOUS

## 2023-06-06 NOTE — Anesthesia Procedure Notes (Signed)
 Procedure Name: General with mask airway Date/Time: 06/06/2023 10:43 AM  Performed by: Mohammed Kindle, CRNAPre-anesthesia Checklist: Patient identified, Emergency Drugs available, Suction available and Patient being monitored Patient Re-evaluated:Patient Re-evaluated prior to induction Oxygen Delivery Method: Simple face mask Induction Type: IV induction Placement Confirmation: positive ETCO2 and breath sounds checked- equal and bilateral Dental Injury: Teeth and Oropharynx as per pre-operative assessment

## 2023-06-06 NOTE — Op Note (Signed)
 [facility] Patient Name: Jessica Underwood Procedure Date: 06/06/2023 10:27 AM MRN: [id number] Account #: [id number] Date of Birth: 02-22-1956 Admit Type: Outpatient Age: 68 Room: [facility] ENDO ROOM 3 Gender: Female Note Status: Supervisor Override Instrument Name: Prentice Docker [id number] Procedure:             Colonoscopy Indications:           Family history of colon cancer in a first-degree                         relative before age 46 years, Iron deficiency anemia,                         Follow-up for history of adenomatous polyps in the                         colon Providers:             Eather Colas MD, MD Referring MD:          Barbette Reichmann, MD (Referring MD) Medicines:             Monitored Anesthesia Care Complications:         No immediate complications. Estimated blood loss:                         Minimal. Procedure:             Pre-Anesthesia Assessment:                        - Prior to the procedure, a History and Physical was                         performed, and patient medications and allergies were                         reviewed. The patient is competent. The risks and                         benefits of the procedure and the sedation options and                         risks were discussed with the patient. All questions                         were answered and informed consent was obtained.                         Patient identification and proposed procedure were                         verified by the physician, the nurse, the                         anesthesiologist, the anesthetist and the technician                         in the endoscopy suite. Mental Status Examination:  alert and oriented. Airway Examination: normal                         oropharyngeal airway and neck mobility. Respiratory                         Examination: clear to auscultation. CV Examination:                          normal. Prophylactic Antibiotics: The patient does not                         require prophylactic antibiotics. Prior                         Anticoagulants: The patient has taken no anticoagulant                         or antiplatelet agents. ASA Grade Assessment: III - A                         patient with severe systemic disease. After reviewing                         the risks and benefits, the patient was deemed in                         satisfactory condition to undergo the procedure. The                         anesthesia plan was to use monitored anesthesia care                         (MAC). Immediately prior to administration of                         medications, the patient was re-assessed for adequacy                         to receive sedatives. The heart rate, respiratory                         rate, oxygen saturations, blood pressure, adequacy of                         pulmonary ventilation, and response to care were                         monitored throughout the procedure. The physical                         status of the patient was re-assessed after the                         procedure.                        After obtaining informed consent, the colonoscope was  passed under direct vision. Throughout the procedure,                         the patient's blood pressure, pulse, and oxygen                         saturations were monitored continuously. The                         Colonoscope was introduced through the anus and                         advanced to the the terminal ileum, with                         identification of the appendiceal orifice and IC                         valve. The colonoscopy was performed without                         difficulty. The patient tolerated the procedure well.                         The quality of the bowel preparation was good. The                         terminal ileum, ileocecal valve,  appendiceal orifice,                         and rectum were photographed. Findings:      The perianal and digital rectal examinations were normal.      The terminal ileum appeared normal.      Two sessile polyps were found in the ascending colon. The polyps were 3       to 4 mm in size. These polyps were removed with a cold snare. Resection       and retrieval were complete. Estimated blood loss was minimal.      A 2 mm polyp was found in the hepatic flexure. The polyp was sessile.       The polyp was removed with a jumbo cold forceps. Resection and retrieval       were complete. Estimated blood loss was minimal.      A 10 mm polyp was found in the descending colon. The polyp was       semi-pedunculated. The polyp was removed with a hot snare. Resection and       retrieval were complete. Estimated blood loss was minimal.      Multiple small-mouthed diverticula were found in the sigmoid colon.      A 3 mm polyp was found in the sigmoid colon. The polyp was sessile. The       polyp was removed with a cold snare. Resection and retrieval were       complete. Estimated blood loss was minimal.      The exam was otherwise without abnormality on direct and retroflexion       views. Impression:            - The examined portion of the ileum was normal.                        -  Two 3 to 4 mm polyps in the ascending colon, removed                         with a cold snare. Resected and retrieved.                        - One 2 mm polyp at the hepatic flexure, removed with                         a jumbo cold forceps. Resected and retrieved.                        - One 10 mm polyp in the descending colon, removed                         with a hot snare. Resected and retrieved.                        - Diverticulosis in the sigmoid colon.                        - One 3 mm polyp in the sigmoid colon, removed with a                         cold snare. Resected and retrieved.                         - The examination was otherwise normal on direct and                         retroflexion views. Recommendation:        - Discharge patient to home.                        - Resume previous diet.                        - Continue present medications.                        - Await pathology results.                        - Repeat colonoscopy in 3 years for surveillance.                        - Return to referring physician as previously                         scheduled. Procedure Code(s):     --- Professional ---                        [id number], Colonoscopy, flexible; with removal of                         tumor(s), polyp(s), or other lesion(s) by snare                         technique  29562, 59, Colonoscopy, flexible; with biopsy, single                         or multiple Diagnosis Code(s):     --- Professional ---                        D12.2, Benign neoplasm of ascending colon                        D12.3, Benign neoplasm of transverse colon (hepatic                         flexure or splenic flexure)                        D12.4, Benign neoplasm of descending colon                        D12.5, Benign neoplasm of sigmoid colon                        D50.9, Iron deficiency anemia, unspecified                        K57.30, Diverticulosis of large intestine without                         perforation or abscess without bleeding CPT copyright 2022 American Medical Association. All rights reserved. The codes documented in this report are preliminary and upon coder review may  be revised to meet current compliance requirements. Eather Colas MD, MD 06/06/2023 11:24:06 AM Number of Addenda: 0 Note Initiated On: 06/06/2023 10:27 AM Scope Withdrawal Time: 0 hours 12 minutes 12 seconds  Total Procedure Duration: 0 hours 19 minutes 22 seconds  Estimated Blood Loss:  Estimated blood loss was minimal.      [facility]

## 2023-06-06 NOTE — Op Note (Signed)
 [facility] Gastroenterology Patient Name: Jessica Underwood Procedure Date: 06/06/2023 10:28 AM MRN: [id number] Account #: [id number] Date of Birth: 01/09/1956 Admit Type: Outpatient Age: 68 Room: [facility] ENDO ROOM 3 Gender: Female Note Status: Supervisor Override Instrument Name: Patton Salles Endoscope [id number] Procedure:             Upper GI endoscopy Indications:           Iron deficiency anemia, Nausea Providers:             Eather Colas MD, MD Referring MD:          Barbette Reichmann, MD (Referring MD) Medicines:             Monitored Anesthesia Care Complications:         No immediate complications. Estimated blood loss:                         Minimal. Procedure:             Pre-Anesthesia Assessment:                        - Prior to the procedure, a History and Physical was                         performed, and patient medications and allergies were                         reviewed. The patient is competent. The risks and                         benefits of the procedure and the sedation options and                         risks were discussed with the patient. All questions                         were answered and informed consent was obtained.                         Patient identification and proposed procedure were                         verified by the physician, the nurse, the                         anesthesiologist, the anesthetist and the technician                         in the endoscopy suite. Mental Status Examination:                         alert and oriented. Airway Examination: normal                         oropharyngeal airway and neck mobility. Respiratory                         Examination: clear to auscultation. CV Examination:  normal. Prophylactic Antibiotics: The patient does not                         require prophylactic antibiotics. Prior                         Anticoagulants: The patient has taken no  anticoagulant                         or antiplatelet agents. ASA Grade Assessment: III - A                         patient with severe systemic disease. After reviewing                         the risks and benefits, the patient was deemed in                         satisfactory condition to undergo the procedure. The                         anesthesia plan was to use monitored anesthesia care                         (MAC). Immediately prior to administration of                         medications, the patient was re-assessed for adequacy                         to receive sedatives. The heart rate, respiratory                         rate, oxygen saturations, blood pressure, adequacy of                         pulmonary ventilation, and response to care were                         monitored throughout the procedure. The physical                         status of the patient was re-assessed after the                         procedure.                        After obtaining informed consent, the endoscope was                         passed under direct vision. Throughout the procedure,                         the patient's blood pressure, pulse, and oxygen                         saturations were monitored continuously. The  Endosonoscope was introduced through the mouth, and                         advanced to the second part of duodenum. The upper GI                         endoscopy was accomplished without difficulty. The                         patient tolerated the procedure well. Findings:      The examined esophagus was normal.      Patchy minimal inflammation characterized by erythema was found in the       gastric antrum. Biopsies were taken with a cold forceps for Helicobacter       pylori testing. Estimated blood loss was minimal.      The examined duodenum was normal. Impression:            - Normal esophagus.                        - Gastritis.  Biopsied.                        - Normal examined duodenum. Recommendation:        - Discharge patient to home.                        - Resume previous diet.                        - Continue present medications.                        - Await pathology results.                        - Return to referring physician as previously                         scheduled. Procedure Code(s):     --- Professional ---                        [phone], Esophagogastroduodenoscopy, flexible,                         transoral; with biopsy, single or multiple Diagnosis Code(s):     --- Professional ---                        K29.70, Gastritis, unspecified, without bleeding                        D50.9, Iron deficiency anemia, unspecified CPT copyright 2022 American Medical Association. All rights reserved. The codes documented in this report are preliminary and upon coder review may  be revised to meet current compliance requirements. Eather Colas MD, MD 06/06/2023 11:20:49 AM Number of Addenda: 0 Note Initiated On: 06/06/2023 10:28 AM Estimated Blood Loss:  Estimated blood loss was minimal.      [facility]

## 2023-06-06 NOTE — H&P (Signed)
 Outpatient short stay form Pre-procedure 06/06/2023  Regis Bill, MD  Primary Physician: Barbette Reichmann, MD  Reason for visit:  IDA  History of present illness:    68 y/o lady with history of RA, COPD, obesity, and DM II here for EGD/Colonoscopy for IDA. Last colonoscopy in 2016 with multiple Ta's. Family history of colon cancer in her Father in his 20's and stomach cancer in her Mother. No blood thinners. History of hysterectomy and appendectomy.    Current Facility-Administered Medications:    0.9 %  sodium chloride infusion, , Intravenous, Continuous, Lasean Gorniak, Rossie Muskrat, MD, Last Rate: 20 mL/hr at 06/06/23 1010, 20 mL/hr at 06/06/23 1010  Medications Prior to Admission  Medication Sig Dispense Refill Last Dose/Taking   acetaminophen (TYLENOL) 650 MG CR tablet Take 650 mg by mouth every 8 (eight) hours as needed for pain.   Past Week   clonazePAM (KLONOPIN) 1 MG tablet Take 1 mg by mouth 2 (two) times daily as needed.    06/06/2023 at  6:00 AM   etanercept (ENBREL) 50 MG/ML injection Inject 50 mg into the skin once a week.   Past Week   hydroxychloroquine (PLAQUENIL) 200 MG tablet Take 200 mg by mouth 2 (two) times daily.   Past Week   lisinopril (ZESTRIL) 5 MG tablet Take 1 tablet by mouth daily.   06/06/2023 at  6:00 AM   metFORMIN (GLUCOPHAGE) 500 MG tablet Take 1 tablet by mouth daily.   Past Week   predniSONE (DELTASONE) 5 MG tablet Take 5 mg by mouth daily with breakfast.   Past Week   rosuvastatin (CRESTOR) 20 MG tablet Take 20 mg by mouth daily.   06/06/2023 at  6:00 AM   SYNTHROID 100 MCG tablet Take 1 tablet by mouth daily.   06/06/2023 at  6:00 AM   albuterol (VENTOLIN HFA) 108 (90 Base) MCG/ACT inhaler Inhale 1-2 puffs into the lungs every 6 (six) hours as needed for wheezing or shortness of breath. (Patient not taking: Reported on 05/26/2023)      Aspirin-Acetaminophen-Caffeine (EXCEDRIN PO) Take by mouth as needed. (Patient not taking: Reported on 05/26/2023)       fluticasone (FLONASE) 50 MCG/ACT nasal spray Place 2 sprays into both nostrils daily. (Patient not taking: Reported on 05/26/2023)      ibuprofen (ADVIL) 200 MG tablet Take 200 mg by mouth every 6 (six) hours as needed. (Patient not taking: Reported on 05/26/2023)      ondansetron (ZOFRAN) 4 MG tablet Take 4-8 mg by mouth every 8 (eight) hours as needed for nausea or vomiting. (Patient not taking: Reported on 05/26/2023)      promethazine (PHENERGAN) 12.5 MG tablet 1/2 to 1 po q4-6 h prn nausea or vomiting (Patient not taking: Reported on 05/26/2023) 30 tablet 3      Allergies  Allergen Reactions   Nitrofurantoin Other (See Comments)   Penicillin G Itching     Past Medical History:  Diagnosis Date   Arthritis    fingers, back, left shoulder, knees   Colon polyps 2016   COPD (chronic obstructive pulmonary disease) (HCC)    Diabetes mellitus    Diverticulitis large intestine 12/2018   UNCOMPLICATED   Diverticulosis    Dyspnea    with exertion   Full dentures    Hearing aid worn    bilateral   Hypertension    Motion sickness    PONV (postoperative nausea and vomiting)     Review of systems:  Otherwise negative.  Physical Exam  Gen: Alert, oriented. Appears stated age.  HEENT: PERRLA. Lungs: No respiratory distress CV: RRR Abd: soft, benign, no masses Ext: No edema    Planned procedures: Proceed with EGD/colonoscopy. The patient understands the nature of the planned procedure, indications, risks, alternatives and potential complications including but not limited to bleeding, infection, perforation, damage to internal organs and possible oversedation/side effects from anesthesia. The patient agrees and gives consent to proceed.  Please refer to procedure notes for findings, recommendations and patient disposition/instructions.     Regis Bill, MD [facility] Gastroenterology

## 2023-06-06 NOTE — Interval H&P Note (Signed)
 History and Physical Interval Note:  06/06/2023 10:37 AM  Jessica Underwood  has presented today for surgery, with the diagnosis of IDA.  The various methods of treatment have been discussed with the patient and family. After consideration of risks, benefits and other options for treatment, the patient has consented to  Procedure(s) with comments: COLONOSCOPY WITH PROPOFOL (N/A) - Oral Diabetic ESOPHAGOGASTRODUODENOSCOPY (EGD) WITH PROPOFOL (N/A) as a surgical intervention.  The patient's history has been reviewed, patient examined, no change in status, stable for surgery.  I have reviewed the patient's chart and labs.  Questions were answered to the patient's satisfaction.     Regis Bill  Ok to proceed with EGD/Colonoscopy

## 2023-06-06 NOTE — Anesthesia Preprocedure Evaluation (Addendum)
 Anesthesia Evaluation  Patient identified by MRN, date of birth, ID band Patient awake    Reviewed: Allergy & Precautions, H&P , NPO status , Patient's Chart, lab work & pertinent test results, reviewed documented beta blocker date and time   History of Anesthesia Complications (+) PONV and history of anesthetic complications  Airway Mallampati: II   Neck ROM: full    Dental  (+) Poor Dentition   Pulmonary shortness of breath and with exertion, COPD, former smoker   Pulmonary exam normal        Cardiovascular Exercise Tolerance: Poor hypertension, On Medications Normal cardiovascular exam Rhythm:regular Rate:Normal     Neuro/Psych negative neurological ROS  negative psych ROS   GI/Hepatic negative GI ROS, Neg liver ROS,,,  Endo/Other  diabetes, Well Controlled    Renal/GU negative Renal ROS  negative genitourinary   Musculoskeletal   Abdominal   Peds  Hematology  (+) Blood dyscrasia, anemia   Anesthesia Other Findings Past Medical History: No date: Arthritis     Comment:  fingers, back, left shoulder, knees 2016: Colon polyps No date: COPD (chronic obstructive pulmonary disease) (HCC) No date: Diabetes mellitus 12/2018: Diverticulitis large intestine     Comment:  UNCOMPLICATED No date: Diverticulosis No date: Dyspnea     Comment:  with exertion No date: Full dentures No date: Hearing aid worn     Comment:  bilateral No date: Hypertension No date: Motion sickness No date: PONV (postoperative nausea and vomiting) Past Surgical History: No date: ABDOMINAL HYSTERECTOMY No date: APPENDECTOMY 07/25/2020: CATARACT EXTRACTION W/PHACO; Right     Comment:  Procedure: CATARACT EXTRACTION PHACO AND INTRAOCULAR               LENS PLACEMENT (IOC) RIGHT DIABETIC 11.20 01:16.2 14.7%;               Surgeon: Lockie Mola, MD;  Location: [facility]               S[facility]ERY [facility];  Service: Ophthalmology;  Laterality:                Right;  COVID + 05-05-20 [facility]               COPY IN CHART 08/06/2020: CATARACT EXTRACTION W/PHACO; Left     Comment:  Procedure: CATARACT EXTRACTION PHACO AND INTRAOCULAR               LENS PLACEMENT (IOC) LEFT DIABETIC 11.06 01:36.9 11.4%;                Surgeon: Lockie Mola, MD;  Location: [facility]               SURGERY [facility];  Service: Ophthalmology;  Laterality: Left;              Diabetic - oral meds 2016: COLONOSCOPY W/ POLYPECTOMY     Comment:  COLON POLYPS No date: EYE SURGERY No date: TONSILLECTOMY BMI    Body Mass Index: 36.21 kg/m     Reproductive/Obstetrics negative OB ROS                              Anesthesia Physical Anesthesia Plan  ASA: 3  Anesthesia Plan: General   Post-op Pain Management:    Induction:   PONV Risk Score and Plan:   Airway Management Planned:   Additional Equipment:   Intra-op Plan:   Post-operative Plan:   Informed Consent: I have  reviewed the patients History and Physical, chart, labs and discussed the procedure including the risks, benefits and alternatives for the proposed anesthesia with the patient or authorized representative who has indicated his/her understanding and acceptance.     Dental Advisory Given  Plan Discussed with: CRNA  Anesthesia Plan Comments:         Anesthesia Quick Evaluation

## 2023-06-06 NOTE — Transfer of Care (Signed)
 Immediate Anesthesia Transfer of Care Note  Patient: Jessica Underwood  Procedure(s) Performed: COLONOSCOPY WITH PROPOFOL ESOPHAGOGASTRODUODENOSCOPY (EGD) WITH PROPOFOL BIOPSY POLYPECTOMY  Patient Location: Endoscopy Unit  Anesthesia Type:General  Level of Consciousness: drowsy and patient cooperative  Airway & Oxygen Therapy: Patient Spontanous Breathing and Patient connected to face mask oxygen  Post-op Assessment: Report given to RN and Post -op Vital signs reviewed and stable  Post vital signs: Reviewed and stable  Last Vitals:  Vitals Value Taken Time  BP 105/56 06/06/23 1117  Temp 36.4 C 06/06/23 1116  Pulse 92 06/06/23 1119  Resp 20 06/06/23 1119  SpO2 100 % 06/06/23 1119  Vitals shown include unfiled device data.  Last Pain:  Vitals:   06/06/23 1116  TempSrc: Temporal  PainSc: Asleep         Complications: No notable events documented.

## 2023-06-07 LAB — SURGICAL PATHOLOGY

## 2023-06-07 NOTE — Anesthesia Postprocedure Evaluation (Signed)
 Anesthesia Post Note  Patient: Jessica Underwood  Procedure(s) Performed: COLONOSCOPY WITH PROPOFOL ESOPHAGOGASTRODUODENOSCOPY (EGD) WITH PROPOFOL BIOPSY POLYPECTOMY  Patient location during evaluation: PACU Anesthesia Type: General Level of consciousness: awake and alert Pain management: pain level controlled Vital Signs Assessment: post-procedure vital signs reviewed and stable Respiratory status: spontaneous breathing, nonlabored ventilation, respiratory function stable and patient connected to nasal cannula oxygen Cardiovascular status: blood pressure returned to baseline and stable Postop Assessment: no apparent nausea or vomiting Anesthetic complications: no   No notable events documented.   Last Vitals:  Vitals:   06/06/23 1126 06/06/23 1136  BP: 119/62 107/63  Pulse: 92 82  Resp: 20 18  Temp:    SpO2: 100% 100%    Last Pain:  Vitals:   06/07/23 0802  TempSrc:   PainSc: 0-No pain                 Yevette Edwards

## 2023-06-13 ENCOUNTER — Inpatient Hospital Stay: Payer: Medicare HMO | Attending: Internal Medicine

## 2023-06-13 ENCOUNTER — Telehealth: Payer: Self-pay | Admitting: Internal Medicine

## 2023-06-13 VITALS — BP 127/57 | HR 78 | Temp 98.0°F | Resp 20

## 2023-06-13 DIAGNOSIS — D509 Iron deficiency anemia, unspecified: Secondary | ICD-10-CM | POA: Insufficient documentation

## 2023-06-13 MED ORDER — IRON SUCROSE 20 MG/ML IV SOLN
200.0000 mg | Freq: Once | INTRAVENOUS | Status: AC
  Administered 2023-06-13: 200 mg via INTRAVENOUS
  Filled 2023-06-13: qty 10

## 2023-06-13 MED ORDER — SODIUM CHLORIDE 0.9% FLUSH
10.0000 mL | Freq: Once | INTRAVENOUS | Status: AC | PRN
  Administered 2023-06-13: 10 mL
  Filled 2023-06-13: qty 10

## 2023-06-13 NOTE — Telephone Encounter (Signed)
 Left vm about appt time change for Monday 3/10. Needed to move iron appt to afternoon to fit in a long chemo.

## 2023-06-20 ENCOUNTER — Inpatient Hospital Stay: Payer: Medicare HMO

## 2023-06-20 ENCOUNTER — Inpatient Hospital Stay

## 2023-06-20 VITALS — BP 146/60 | HR 79 | Temp 97.9°F | Resp 18

## 2023-06-20 DIAGNOSIS — D509 Iron deficiency anemia, unspecified: Secondary | ICD-10-CM

## 2023-06-20 MED ORDER — IRON SUCROSE 20 MG/ML IV SOLN
200.0000 mg | Freq: Once | INTRAVENOUS | Status: AC
  Administered 2023-06-20: 200 mg via INTRAVENOUS
  Filled 2023-06-20: qty 10

## 2023-06-27 ENCOUNTER — Inpatient Hospital Stay: Payer: Medicare HMO

## 2023-06-27 VITALS — BP 107/48 | HR 78 | Temp 98.0°F | Resp 18

## 2023-06-27 DIAGNOSIS — D509 Iron deficiency anemia, unspecified: Secondary | ICD-10-CM | POA: Diagnosis not present

## 2023-06-27 MED ORDER — IRON SUCROSE 20 MG/ML IV SOLN
200.0000 mg | Freq: Once | INTRAVENOUS | Status: AC
  Administered 2023-06-27: 200 mg via INTRAVENOUS

## 2023-06-27 MED ORDER — SODIUM CHLORIDE 0.9% FLUSH
10.0000 mL | Freq: Once | INTRAVENOUS | Status: AC | PRN
  Administered 2023-06-27: 10 mL
  Filled 2023-06-27: qty 10

## 2023-07-04 ENCOUNTER — Inpatient Hospital Stay: Payer: Medicare HMO

## 2023-07-04 VITALS — BP 115/58 | HR 78 | Temp 96.0°F | Resp 18

## 2023-07-04 DIAGNOSIS — D509 Iron deficiency anemia, unspecified: Secondary | ICD-10-CM | POA: Diagnosis not present

## 2023-07-04 MED ORDER — SODIUM CHLORIDE 0.9% FLUSH
10.0000 mL | Freq: Once | INTRAVENOUS | Status: AC | PRN
  Administered 2023-07-04: 10 mL
  Filled 2023-07-04: qty 10

## 2023-07-04 MED ORDER — IRON SUCROSE 20 MG/ML IV SOLN
200.0000 mg | Freq: Once | INTRAVENOUS | Status: AC
  Administered 2023-07-04: 200 mg via INTRAVENOUS

## 2023-07-11 ENCOUNTER — Inpatient Hospital Stay: Payer: Medicare HMO

## 2023-07-11 ENCOUNTER — Telehealth: Payer: Self-pay | Admitting: *Deleted

## 2023-07-11 VITALS — BP 137/56 | HR 67 | Temp 98.0°F | Resp 18

## 2023-07-11 DIAGNOSIS — D509 Iron deficiency anemia, unspecified: Secondary | ICD-10-CM | POA: Diagnosis not present

## 2023-07-11 MED ORDER — IRON SUCROSE 20 MG/ML IV SOLN
200.0000 mg | Freq: Once | INTRAVENOUS | Status: AC
  Administered 2023-07-11: 200 mg via INTRAVENOUS

## 2023-07-11 NOTE — Telephone Encounter (Signed)
 Pt presented to clinic for her IV venofer. Patient reported to infusion nurse that she is experiencing dark tarry stools- starting this week. Discussed pt's concerns with lauren, NP-who recommended cbc and hold tube and pt to f/u with gI. Patient left before this information was communicated to patient. I personally discuss her concerns. Patient currently being treated with antibiotics and is also on pepto 4 times a day as well as prilosec. She started the pepto in the last week due to gastritis. Pt declined apts with additional labs. She declines referral back to GI.  She will have pcp recheck labs if needed but as of now, pt declines further work up. She is not overly concerned about the discoloration of the dark stools at this time. Pt advised if she becomes symptomatic- short of breath/severe fatigue or notes bright red bleeding to go to ER. She can also call our office back and let us know.

## 2023-07-11 NOTE — Patient Instructions (Signed)

## 2023-07-28 ENCOUNTER — Other Ambulatory Visit: Payer: Self-pay | Admitting: Internal Medicine

## 2023-07-28 DIAGNOSIS — E119 Type 2 diabetes mellitus without complications: Secondary | ICD-10-CM | POA: Diagnosis not present

## 2023-07-28 DIAGNOSIS — Z1231 Encounter for screening mammogram for malignant neoplasm of breast: Secondary | ICD-10-CM | POA: Diagnosis not present

## 2023-07-28 DIAGNOSIS — B9681 Helicobacter pylori [H. pylori] as the cause of diseases classified elsewhere: Secondary | ICD-10-CM | POA: Diagnosis not present

## 2023-07-28 DIAGNOSIS — M069 Rheumatoid arthritis, unspecified: Secondary | ICD-10-CM | POA: Diagnosis not present

## 2023-07-28 DIAGNOSIS — D649 Anemia, unspecified: Secondary | ICD-10-CM | POA: Diagnosis not present

## 2023-07-28 DIAGNOSIS — F329 Major depressive disorder, single episode, unspecified: Secondary | ICD-10-CM | POA: Diagnosis not present

## 2023-07-28 DIAGNOSIS — I10 Essential (primary) hypertension: Secondary | ICD-10-CM | POA: Diagnosis not present

## 2023-07-28 DIAGNOSIS — K2970 Gastritis, unspecified, without bleeding: Secondary | ICD-10-CM | POA: Diagnosis not present

## 2023-07-28 DIAGNOSIS — J3089 Other allergic rhinitis: Secondary | ICD-10-CM | POA: Diagnosis not present

## 2023-08-15 ENCOUNTER — Other Ambulatory Visit: Payer: Self-pay | Admitting: *Deleted

## 2023-08-15 DIAGNOSIS — E538 Deficiency of other specified B group vitamins: Secondary | ICD-10-CM

## 2023-08-15 DIAGNOSIS — D509 Iron deficiency anemia, unspecified: Secondary | ICD-10-CM

## 2023-08-16 ENCOUNTER — Inpatient Hospital Stay: Payer: Medicare HMO

## 2023-08-16 ENCOUNTER — Inpatient Hospital Stay: Payer: Medicare HMO | Admitting: Internal Medicine

## 2023-08-16 ENCOUNTER — Inpatient Hospital Stay: Payer: Medicare HMO | Attending: Internal Medicine

## 2023-08-16 DIAGNOSIS — M0579 Rheumatoid arthritis with rheumatoid factor of multiple sites without organ or systems involvement: Secondary | ICD-10-CM | POA: Diagnosis not present

## 2023-08-16 DIAGNOSIS — Z7960 Long term (current) use of unspecified immunomodulators and immunosuppressants: Secondary | ICD-10-CM | POA: Diagnosis not present

## 2023-08-19 ENCOUNTER — Other Ambulatory Visit: Payer: Medicare HMO

## 2023-08-19 ENCOUNTER — Ambulatory Visit: Payer: Medicare HMO | Admitting: Internal Medicine

## 2023-08-19 ENCOUNTER — Ambulatory Visit: Payer: Medicare HMO

## 2023-11-28 DIAGNOSIS — M0579 Rheumatoid arthritis with rheumatoid factor of multiple sites without organ or systems involvement: Secondary | ICD-10-CM | POA: Diagnosis not present

## 2023-11-28 DIAGNOSIS — Z7960 Long term (current) use of unspecified immunomodulators and immunosuppressants: Secondary | ICD-10-CM | POA: Diagnosis not present

## 2023-11-28 DIAGNOSIS — Z117 Encounter for testing for latent tuberculosis infection: Secondary | ICD-10-CM | POA: Diagnosis not present

## 2023-11-28 DIAGNOSIS — Z111 Encounter for screening for respiratory tuberculosis: Secondary | ICD-10-CM | POA: Diagnosis not present

## 2023-12-26 DIAGNOSIS — M0579 Rheumatoid arthritis with rheumatoid factor of multiple sites without organ or systems involvement: Secondary | ICD-10-CM | POA: Diagnosis not present

## 2023-12-26 DIAGNOSIS — M069 Rheumatoid arthritis, unspecified: Secondary | ICD-10-CM | POA: Diagnosis not present

## 2023-12-26 DIAGNOSIS — Z87891 Personal history of nicotine dependence: Secondary | ICD-10-CM | POA: Diagnosis not present

## 2023-12-26 DIAGNOSIS — Z6836 Body mass index (BMI) 36.0-36.9, adult: Secondary | ICD-10-CM | POA: Diagnosis not present

## 2023-12-26 DIAGNOSIS — K2970 Gastritis, unspecified, without bleeding: Secondary | ICD-10-CM | POA: Diagnosis not present

## 2023-12-26 DIAGNOSIS — Z1331 Encounter for screening for depression: Secondary | ICD-10-CM | POA: Diagnosis not present

## 2023-12-26 DIAGNOSIS — Z1231 Encounter for screening mammogram for malignant neoplasm of breast: Secondary | ICD-10-CM | POA: Diagnosis not present

## 2023-12-26 DIAGNOSIS — Z7960 Long term (current) use of unspecified immunomodulators and immunosuppressants: Secondary | ICD-10-CM | POA: Diagnosis not present

## 2023-12-26 DIAGNOSIS — Z0000 Encounter for general adult medical examination without abnormal findings: Secondary | ICD-10-CM | POA: Diagnosis not present

## 2023-12-26 DIAGNOSIS — I10 Essential (primary) hypertension: Secondary | ICD-10-CM | POA: Diagnosis not present

## 2023-12-26 DIAGNOSIS — Z1159 Encounter for screening for other viral diseases: Secondary | ICD-10-CM | POA: Diagnosis not present

## 2023-12-26 DIAGNOSIS — J4520 Mild intermittent asthma, uncomplicated: Secondary | ICD-10-CM | POA: Diagnosis not present

## 2023-12-26 DIAGNOSIS — B9681 Helicobacter pylori [H. pylori] as the cause of diseases classified elsewhere: Secondary | ICD-10-CM | POA: Diagnosis not present

## 2023-12-26 DIAGNOSIS — M170 Bilateral primary osteoarthritis of knee: Secondary | ICD-10-CM | POA: Diagnosis not present

## 2023-12-26 DIAGNOSIS — E1165 Type 2 diabetes mellitus with hyperglycemia: Secondary | ICD-10-CM | POA: Diagnosis not present

## 2023-12-26 DIAGNOSIS — J3089 Other allergic rhinitis: Secondary | ICD-10-CM | POA: Diagnosis not present

## 2023-12-26 DIAGNOSIS — F3341 Major depressive disorder, recurrent, in partial remission: Secondary | ICD-10-CM | POA: Diagnosis not present

## 2023-12-26 DIAGNOSIS — G252 Other specified forms of tremor: Secondary | ICD-10-CM | POA: Diagnosis not present

## 2023-12-26 DIAGNOSIS — D508 Other iron deficiency anemias: Secondary | ICD-10-CM | POA: Diagnosis not present

## 2023-12-26 DIAGNOSIS — E119 Type 2 diabetes mellitus without complications: Secondary | ICD-10-CM | POA: Diagnosis not present

## 2023-12-29 DIAGNOSIS — E119 Type 2 diabetes mellitus without complications: Secondary | ICD-10-CM | POA: Diagnosis not present

## 2024-01-16 DIAGNOSIS — E119 Type 2 diabetes mellitus without complications: Secondary | ICD-10-CM | POA: Diagnosis not present

## 2024-01-16 DIAGNOSIS — H26493 Other secondary cataract, bilateral: Secondary | ICD-10-CM | POA: Diagnosis not present

## 2024-01-16 DIAGNOSIS — H5203 Hypermetropia, bilateral: Secondary | ICD-10-CM | POA: Diagnosis not present

## 2024-01-16 DIAGNOSIS — H40023 Open angle with borderline findings, high risk, bilateral: Secondary | ICD-10-CM | POA: Diagnosis not present

## 2024-01-16 DIAGNOSIS — H52223 Regular astigmatism, bilateral: Secondary | ICD-10-CM | POA: Diagnosis not present

## 2024-01-16 DIAGNOSIS — H524 Presbyopia: Secondary | ICD-10-CM | POA: Diagnosis not present

## 2024-01-16 DIAGNOSIS — Z961 Presence of intraocular lens: Secondary | ICD-10-CM | POA: Diagnosis not present

## 2024-01-16 DIAGNOSIS — B0239 Other herpes zoster eye disease: Secondary | ICD-10-CM | POA: Diagnosis not present

## 2024-01-16 DIAGNOSIS — H16103 Unspecified superficial keratitis, bilateral: Secondary | ICD-10-CM | POA: Diagnosis not present

## 2024-02-06 DIAGNOSIS — H26493 Other secondary cataract, bilateral: Secondary | ICD-10-CM | POA: Diagnosis not present

## 2024-02-06 DIAGNOSIS — E119 Type 2 diabetes mellitus without complications: Secondary | ICD-10-CM | POA: Diagnosis not present

## 2024-02-06 DIAGNOSIS — Z8669 Personal history of other diseases of the nervous system and sense organs: Secondary | ICD-10-CM | POA: Diagnosis not present

## 2024-02-06 DIAGNOSIS — H524 Presbyopia: Secondary | ICD-10-CM | POA: Diagnosis not present

## 2024-02-06 DIAGNOSIS — M0560 Rheumatoid arthritis of unspecified site with involvement of other organs and systems: Secondary | ICD-10-CM | POA: Diagnosis not present

## 2024-02-06 DIAGNOSIS — Z79899 Other long term (current) drug therapy: Secondary | ICD-10-CM | POA: Diagnosis not present

## 2024-02-06 DIAGNOSIS — H40023 Open angle with borderline findings, high risk, bilateral: Secondary | ICD-10-CM | POA: Diagnosis not present

## 2024-02-06 DIAGNOSIS — H5203 Hypermetropia, bilateral: Secondary | ICD-10-CM | POA: Diagnosis not present

## 2024-02-06 DIAGNOSIS — H52223 Regular astigmatism, bilateral: Secondary | ICD-10-CM | POA: Diagnosis not present

## 2024-02-13 DIAGNOSIS — M3501 Sicca syndrome with keratoconjunctivitis: Secondary | ICD-10-CM | POA: Diagnosis not present

## 2024-02-13 DIAGNOSIS — H26493 Other secondary cataract, bilateral: Secondary | ICD-10-CM | POA: Diagnosis not present

## 2024-02-13 DIAGNOSIS — H43813 Vitreous degeneration, bilateral: Secondary | ICD-10-CM | POA: Diagnosis not present

## 2024-02-13 DIAGNOSIS — E119 Type 2 diabetes mellitus without complications: Secondary | ICD-10-CM | POA: Diagnosis not present

## 2024-02-20 DIAGNOSIS — M3501 Sicca syndrome with keratoconjunctivitis: Secondary | ICD-10-CM | POA: Diagnosis not present

## 2024-03-05 DIAGNOSIS — M0579 Rheumatoid arthritis with rheumatoid factor of multiple sites without organ or systems involvement: Secondary | ICD-10-CM | POA: Diagnosis not present

## 2024-03-05 DIAGNOSIS — Z7952 Long term (current) use of systemic steroids: Secondary | ICD-10-CM | POA: Diagnosis not present

## 2024-03-05 DIAGNOSIS — Z7960 Long term (current) use of unspecified immunomodulators and immunosuppressants: Secondary | ICD-10-CM | POA: Diagnosis not present

## 2024-04-22 ENCOUNTER — Emergency Department (HOSPITAL_COMMUNITY)

## 2024-04-22 ENCOUNTER — Emergency Department (HOSPITAL_COMMUNITY)
Admission: EM | Admit: 2024-04-22 | Discharge: 2024-04-22 | Disposition: A | Discharge status: Home / Self Care | Attending: Emergency Medicine | Admitting: Emergency Medicine

## 2024-04-22 ENCOUNTER — Other Ambulatory Visit: Payer: Self-pay

## 2024-04-22 DIAGNOSIS — R42 Dizziness and giddiness: Secondary | ICD-10-CM | POA: Diagnosis not present

## 2024-04-22 DIAGNOSIS — M25571 Pain in right ankle and joints of right foot: Secondary | ICD-10-CM | POA: Diagnosis present

## 2024-04-22 DIAGNOSIS — E119 Type 2 diabetes mellitus without complications: Secondary | ICD-10-CM | POA: Diagnosis not present

## 2024-04-22 DIAGNOSIS — I10 Essential (primary) hypertension: Secondary | ICD-10-CM | POA: Insufficient documentation

## 2024-04-22 DIAGNOSIS — Z79899 Other long term (current) drug therapy: Secondary | ICD-10-CM | POA: Diagnosis not present

## 2024-04-22 DIAGNOSIS — S93401A Sprain of unspecified ligament of right ankle, initial encounter: Secondary | ICD-10-CM

## 2024-04-22 DIAGNOSIS — J449 Chronic obstructive pulmonary disease, unspecified: Secondary | ICD-10-CM | POA: Diagnosis not present

## 2024-04-22 DIAGNOSIS — Z7982 Long term (current) use of aspirin: Secondary | ICD-10-CM | POA: Diagnosis not present

## 2024-04-22 DIAGNOSIS — Z7984 Long term (current) use of oral hypoglycemic drugs: Secondary | ICD-10-CM | POA: Insufficient documentation

## 2024-04-22 DIAGNOSIS — X501XXA Overexertion from prolonged static or awkward postures, initial encounter: Secondary | ICD-10-CM | POA: Diagnosis not present

## 2024-04-22 DIAGNOSIS — N390 Urinary tract infection, site not specified: Secondary | ICD-10-CM | POA: Insufficient documentation

## 2024-04-22 LAB — CBC WITH DIFFERENTIAL/PLATELET
Abs Immature Granulocytes: 0.03 K/uL (ref 0.00–0.07)
Basophils Absolute: 0 K/uL (ref 0.0–0.1)
Basophils Relative: 1 %
Eosinophils Absolute: 0.1 K/uL (ref 0.0–0.5)
Eosinophils Relative: 1 %
HCT: 35.2 % — ABNORMAL LOW (ref 36.0–46.0)
Hemoglobin: 11.1 g/dL — ABNORMAL LOW (ref 12.0–15.0)
Immature Granulocytes: 1 %
Lymphocytes Relative: 16 %
Lymphs Abs: 1 K/uL (ref 0.7–4.0)
MCH: 28.2 pg (ref 26.0–34.0)
MCHC: 31.5 g/dL (ref 30.0–36.0)
MCV: 89.3 fL (ref 80.0–100.0)
Monocytes Absolute: 0.5 K/uL (ref 0.1–1.0)
Monocytes Relative: 8 %
Neutro Abs: 4.7 K/uL (ref 1.7–7.7)
Neutrophils Relative %: 73 %
Platelets: 213 K/uL (ref 150–400)
RBC: 3.94 MIL/uL (ref 3.87–5.11)
RDW: 12.8 % (ref 11.5–15.5)
WBC: 6.4 K/uL (ref 4.0–10.5)
nRBC: 0 % (ref 0.0–0.2)

## 2024-04-22 LAB — COMPREHENSIVE METABOLIC PANEL WITH GFR
ALT: 15 U/L (ref 0–44)
AST: 19 U/L (ref 15–41)
Albumin: 4.4 g/dL (ref 3.5–5.0)
Alkaline Phosphatase: 94 U/L (ref 38–126)
Anion gap: 14 (ref 5–15)
BUN: 15 mg/dL (ref 8–23)
CO2: 21 mmol/L — ABNORMAL LOW (ref 22–32)
Calcium: 9.2 mg/dL (ref 8.9–10.3)
Chloride: 105 mmol/L (ref 98–111)
Creatinine, Ser: 0.81 mg/dL (ref 0.44–1.00)
GFR, Estimated: >60 mL/min
Glucose, Bld: 131 mg/dL — ABNORMAL HIGH (ref 70–99)
Potassium: 4 mmol/L (ref 3.5–5.1)
Sodium: 141 mmol/L (ref 135–145)
Total Bilirubin: 0.4 mg/dL (ref 0.0–1.2)
Total Protein: 7.1 g/dL (ref 6.5–8.1)

## 2024-04-22 LAB — URINALYSIS, ROUTINE W REFLEX MICROSCOPIC
Bilirubin Urine: NEGATIVE
Glucose, UA: NEGATIVE mg/dL
Hgb urine dipstick: NEGATIVE
Ketones, ur: NEGATIVE mg/dL
Leukocytes,Ua: NEGATIVE
Nitrite: POSITIVE — AB
Protein, ur: NEGATIVE mg/dL
Specific Gravity, Urine: 1.01 (ref 1.005–1.030)
pH: 6 (ref 5.0–8.0)

## 2024-04-22 LAB — CBG MONITORING, ED: Glucose-Capillary: 136 mg/dL — ABNORMAL HIGH (ref 70–99)

## 2024-04-22 MED ORDER — CEFUROXIME AXETIL 250 MG PO TABS
500.0000 mg | ORAL_TABLET | Freq: Once | ORAL | Status: AC
  Administered 2024-04-22: 500 mg via ORAL
  Filled 2024-04-22: qty 2

## 2024-04-22 MED ORDER — ONDANSETRON HCL 4 MG/2ML IJ SOLN
4.0000 mg | Freq: Once | INTRAMUSCULAR | Status: AC
  Administered 2024-04-22: 4 mg via INTRAVENOUS
  Filled 2024-04-22: qty 2

## 2024-04-22 MED ORDER — ACETAMINOPHEN 500 MG PO TABS
1000.0000 mg | ORAL_TABLET | Freq: Once | ORAL | Status: AC
  Administered 2024-04-22: 1000 mg via ORAL
  Filled 2024-04-22: qty 2

## 2024-04-22 MED ORDER — SODIUM CHLORIDE 0.9 % IV BOLUS
1000.0000 mL | Freq: Once | INTRAVENOUS | Status: AC
  Administered 2024-04-22: 1000 mL via INTRAVENOUS

## 2024-04-22 MED ORDER — DIAZEPAM 2 MG PO TABS
2.0000 mg | ORAL_TABLET | Freq: Once | ORAL | Status: AC
  Administered 2024-04-22: 2 mg via ORAL
  Filled 2024-04-22: qty 1

## 2024-04-22 MED ORDER — CEFUROXIME AXETIL 500 MG PO TABS: 500.0000 mg | ORAL_TABLET | Freq: Two times a day (BID) | ORAL | 0 refills | Status: AC

## 2024-04-22 MED ORDER — KETOROLAC TROMETHAMINE 30 MG/ML IJ SOLN
30.0000 mg | Freq: Once | INTRAMUSCULAR | Status: AC
  Administered 2024-04-22: 30 mg via INTRAVENOUS
  Filled 2024-04-22: qty 1

## 2024-04-22 MED ORDER — MECLIZINE HCL 25 MG PO TABS: 25.0000 mg | ORAL_TABLET | Freq: Three times a day (TID) | ORAL | 0 refills | Status: AC | PRN

## 2024-04-22 MED ORDER — MECLIZINE HCL 12.5 MG PO TABS
25.0000 mg | ORAL_TABLET | Freq: Once | ORAL | Status: AC
  Administered 2024-04-22: 25 mg via ORAL
  Filled 2024-04-22: qty 2

## 2024-04-22 NOTE — ED Triage Notes (Signed)
 Pt c/o dizziness that is causing nausea since this morning 2 am.

## 2024-04-22 NOTE — Discharge Instructions (Addendum)
 Seen in the ER today for dizziness, your symptoms are consistent with peripheral vertigo.  We are treating you with medication, meclizine .  You were also given fluids for dehydration.  Make sure you drink plenty of fluids at home.  We are also treating you for a urinary tract infection with antibiotics.  Make sure you take the full course.  You also had pain in your right ankle from a twisting injury yesterday.  Fortunately, your x-ray showed no sign of a broken bone or dislocation.  We are giving you a brace and Ace wrap to help give you support, you can take over-the-counter Tylenol  as needed for discomfort, use ice and elevation.  Primary care doctor, if you are not improving you may need to follow-up with orthopedics as above.  Regarding the vertigo, follow-up with your PCP, if you are not improving you will likely need to follow-up with ENT as listed above.  Lease come back to the emergency room as needed for any new or worsening symptoms.

## 2024-04-22 NOTE — ED Provider Notes (Signed)
 " [facility] [address] EMERGENCY DEPARTMENT AT [facility] Provider Note   CSN: [id number] Arrival date & time: 04/22/24  9140     Patient presents with: No chief complaint on file.   Jessica Underwood is a 69 y.o. female.  She of hypertension, diabetes, rheumatoid arthritis.  Presents to ER complaining of dizziness described as spinning that started suddenly when she turned over in bed between 130 and 2 AM this morning.  States that she feels like she is on a roller coaster, and felt as if she needed to hold onto the bed to keep from falling off.  She denies any numbness tingling or weakness in her extremities, no speech difficulties, no confusion, no fevers or chills.  Reports over the past week she has felt generally unwell with decreased appetite and decreased p.o. intake.  She had some diarrhea yesterday but before that had constipation.  She denies abdominal pain, denies chest pain.  She reports chronic shortness of breath due to COPD but states this is at her baseline.  Pt also c/o R ankle pain after she stepped wrong and twisted it yesterday in her bedroom, she did not fall to the ground and has been able to bear weight.    HPI     Prior to Admission medications  Medication Sig Start Date End Date Taking? Authorizing Provider  acetaminophen  (TYLENOL ) 650 MG CR tablet Take 650 mg by mouth every 8 (eight) hours as needed for pain.    [provider]  albuterol (VENTOLIN HFA) 108 (90 Base) MCG/ACT inhaler Inhale 1-2 puffs into the lungs every 6 (six) hours as needed for wheezing or shortness of breath. Patient not taking: Reported on 05/26/2023    [provider]  Aspirin -Acetaminophen -Caffeine (EXCEDRIN PO) Take by mouth as needed. Patient not taking: Reported on 05/26/2023    [provider]  clonazePAM  (KLONOPIN ) 1 MG tablet Take 1 mg by mouth 2 (two) times daily as needed.     [provider]  etanercept (ENBREL) 50 MG/ML injection Inject 50 mg into  the skin once a week. 05/19/23   [provider]  fluticasone (FLONASE) 50 MCG/ACT nasal spray Place 2 sprays into both nostrils daily. Patient not taking: Reported on 05/26/2023 08/07/18   [provider]  hydroxychloroquine (PLAQUENIL) 200 MG tablet Take 200 mg by mouth 2 (two) times daily.    [provider]  ibuprofen (ADVIL) 200 MG tablet Take 200 mg by mouth every 6 (six) hours as needed. Patient not taking: Reported on 05/26/2023    [provider]  lisinopril (ZESTRIL) 5 MG tablet Take 1 tablet by mouth daily. 11/24/18   [provider]  metFORMIN (GLUCOPHAGE) 500 MG tablet Take 1 tablet by mouth daily. 12/01/18   [provider]  ondansetron  (ZOFRAN ) 4 MG tablet Take 4-8 mg by mouth every 8 (eight) hours as needed for nausea or vomiting. Patient not taking: Reported on 05/26/2023    [provider]  predniSONE  (DELTASONE ) 5 MG tablet Take 5 mg by mouth daily with breakfast.    [provider]  promethazine  (PHENERGAN ) 12.5 MG tablet 1/2 to 1 po q4-6 h prn nausea or vomiting Patient not taking: Reported on 05/26/2023 01/25/19   Fields, Sandi L, MD  rosuvastatin (CRESTOR) 20 MG tablet Take 20 mg by mouth daily.    [provider]  SYNTHROID 100 MCG tablet Take 1 tablet by mouth daily. 12/11/18   [provider]    Allergies: Nitrofurantoin and  Penicillin g    Review of Systems  Constitutional:  Positive for appetite change.  HENT:  Negative for congestion, sneezing and sore throat.   Respiratory:  Negative for cough and shortness of breath.   Cardiovascular:  Negative for chest pain and leg swelling.  Gastrointestinal:  Positive for diarrhea and nausea. Negative for abdominal pain.  Genitourinary:  Positive for frequency and urgency. Negative for dysuria and hematuria.  Musculoskeletal:        Right ankle pain  Skin:  Negative for rash.  Neurological:  Positive for dizziness and light-headedness.  Negative for tremors, seizures, syncope, facial asymmetry, speech difficulty, weakness, numbness and headaches.    Updated Vital Signs There were no vitals taken for this visit.  Physical Exam Vitals and nursing note reviewed.  Constitutional:      General: She is not in acute distress.    Appearance: She is well-developed.  HENT:     Head: Normocephalic and atraumatic.     Right Ear: Tympanic membrane normal.     Left Ear: Tympanic membrane normal.     Nose: Nose normal.     Mouth/Throat:     Mouth: Mucous membranes are dry.  Eyes:     Extraocular Movements: Extraocular movements intact.     Conjunctiva/sclera: Conjunctivae normal.     Pupils: Pupils are equal, round, and reactive to light.  Cardiovascular:     Rate and Rhythm: Normal rate and regular rhythm.     Heart sounds: No murmur heard. Pulmonary:     Effort: Pulmonary effort is normal. No respiratory distress.     Breath sounds: Normal breath sounds.  Abdominal:     Palpations: Abdomen is soft.     Tenderness: There is no abdominal tenderness.  Musculoskeletal:        General: Tenderness present. No swelling. Normal range of motion.     Cervical back: Neck supple.     Right lower leg: No edema.     Left lower leg: No edema.     Comments: Right ankle is not swollen in comparison to the left, there is no bruising or crepitus.  Normal range of motion, DP and PT pulses are intact in the right foot.  There is mild tenderness just anterior to the lateral malleolus of the right ankle.  Achilles tendon is intact without tenderness or defect.  Skin:    General: Skin is warm and dry.     Capillary Refill: Capillary refill takes less than 2 seconds.  Neurological:     General: No focal deficit present.     Mental Status: She is alert and oriented to person, place, and time.  Psychiatric:        Mood and Affect: Mood normal.     (all labs ordered are listed, but only abnormal results are displayed) Labs Reviewed - No  data to display  EKG: None  Radiology: No results found.   Procedures   Medications Ordered in the ED - No data to display  Clinical Course as of 04/22/24 1252  Sun Apr 22, 2024  1001 Patient presents with sudden onset vertigo with no other neurologic findings, no nystagmus, no weakness, no numbness or tingling.  Started abruptly about 7 hours prior to arrival, she was given meclizine , vertigo and fluids as she does report decreased p.o. intake and some diarrhea over the past week suspect she has some dehydration as well.  Labs are pending but reassessment after medications show patient states she feels significantly better. [  CB]    Clinical Course User Index [CB] Suellen Sherran LABOR, PA-C                                 Medical Decision Making This patient presents to the ED for concern of dizziness, this involves an extensive number of treatment options, and is a complaint that carries with it a high risk of complications and morbidity.  The differential diagnosis includes BPPV, vertigo, central vertigo, bronchitis, dehydration, orthostatic hypotension, UTI, viral syndrome, other   Co morbidities that complicate the patient evaluation  Rheumatoid arthritis   Additional history obtained:  Additional history obtained from EMR External records from outside source obtained and reviewed including prior notes and labs   Lab Tests:  I Ordered, and personally interpreted labs.  The pertinent results include: CBC with no leukocytosis, globin 11.1, CMP with CO2 decreased at 21, otherwise normal, blood sugar normal at 136, UA with positive nitrate and many bacteria   Imaging Studies ordered:  I ordered imaging studies including x-ray right ankle I independently visualized and interpreted imaging which showed no fracture, dislocation I agree with the radiologist interpretation     Problem List / ED Course / Critical interventions / Medication management  Presents with  complaints of vertigo sudden onset between 1:30 AM and 2 AM.  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  She is feeling much better after IV fluids, meclizine  and Valium .  Decreased p.o. intake over the past week due to lack of appetite and feeling generally unwell, suspect dehydration.  Started having urinary frequency and urgency last night, urinating about every 30 minutes.  UA is positive for nitrate and many bacteria but no white blood cells or red blood cells, given symptoms we will send urine for culture and treat for UTI.  She is agreeable plan of care.  She is tolerating p.o. at this time.  She also complaining of right ankle pain after twisting injury yesterday, no fracture or dislocation on x-ray.  Will treat for likely mild sprain, there is no significant soft tissue swelling, no crepitus, patient is able to ambulate.  Ordered Ace wrap and Aircast.  Advised orthopedic follow-up.  Discussed follow-up with PCP as well, and if symptoms or not improving with vertigo she will need to follow-up with ENT.  She was given strict return precautions. I ordered medication as above Reevaluation of the patient after these medicines showed that the patient improved I have reviewed the patients home medicines and have made adjustments as needed    Test / Admission - Considered:  Patient does not meet criteria for inpatient admission at this time, she is stable for discharge.    Amount and/or Complexity of Data Reviewed Labs: ordered. Radiology: ordered.  Risk OTC drugs. Prescription drug management.        Final diagnoses:  None    ED Discharge Orders     None          Suellen Sherran LABOR DEVONNA 04/22/24 1306    Dean Clarity, MD 04/22/24 1350  "

## 2024-04-22 NOTE — ED Notes (Signed)
 Xray at bedside

## 2024-04-24 LAB — URINE CULTURE: Culture: 80000 — AB

## 2024-04-25 ENCOUNTER — Telehealth (HOSPITAL_BASED_OUTPATIENT_CLINIC_OR_DEPARTMENT_OTHER): Payer: Self-pay | Admitting: *Deleted

## 2024-04-25 NOTE — Telephone Encounter (Signed)
 Post ED Visit - Positive Culture Follow-up  Culture report reviewed by antimicrobial stewardship pharmacist: [facility] [x]  Leonor Bash, [place].D. []  Venetia Gully, Pharm.D., BCPS AQ-ID []  Garrel Crews, Pharm.D., BCPS []  Almarie Lunger, Pharm.D., BCPS []  [place], [address].D., BCPS, AAHIVP []  Rosaline Bihari, Pharm.D., BCPS, AAHIVP []  Vernell Meier, PharmD, BCPS []  Latanya Hint, PharmD, BCPS []  Donald Medley, PharmD, BCPS []  Rocky Bold, PharmD []  Dorothyann Alert, PharmD, BCPS []  Morene Babe, PharmD  [facility] []  Rosaline Edison, PharmD []  Romona Bliss, PharmD []  Dolphus Roller, PharmD []  Veva Seip, Rph []  Vernell Daunt) Leonce, PharmD []  Eva Allis, PharmD []  Rosaline Millet, PharmD []  Iantha Batch, PharmD []  Arvin Gauss, PharmD []  Wanda Hasting, PharmD []  Ronal Rav, PharmD []  Rocky Slade, PharmD []  Bard Jeans, PharmD   Positive urine culture Treated with Cefuroxime  Axetil, organism sensitive to the same and no further patient follow-up is required at this time.  Jessica Underwood 04/25/2024, 12:25 PM
